# Patient Record
Sex: Female | Born: 1990 | Race: White | Hispanic: No | Marital: Single | Smoking: Current every day smoker
Health system: Southern US, Community
[De-identification: ages and names within clinical notes are randomized; demographics above are authoritative.]

## PROBLEM LIST (undated history)

## (undated) DIAGNOSIS — F329 Major depressive disorder, single episode, unspecified: Secondary | ICD-10-CM

## (undated) DIAGNOSIS — A630 Anogenital (venereal) warts: Secondary | ICD-10-CM

## (undated) DIAGNOSIS — F419 Anxiety disorder, unspecified: Secondary | ICD-10-CM

## (undated) DIAGNOSIS — F32A Depression, unspecified: Secondary | ICD-10-CM

## (undated) DIAGNOSIS — F1014 Alcohol abuse with alcohol-induced mood disorder: Secondary | ICD-10-CM

## (undated) DIAGNOSIS — F1410 Cocaine abuse, uncomplicated: Secondary | ICD-10-CM

## (undated) HISTORY — PX: NO PAST SURGERIES: SHX2092

---

## 2010-07-17 ENCOUNTER — Emergency Department (HOSPITAL_COMMUNITY)
Admission: EM | Admit: 2010-07-17 | Discharge: 2010-07-18 | Payer: Self-pay | Source: Home / Self Care | Admitting: Emergency Medicine

## 2010-08-31 ENCOUNTER — Telehealth (INDEPENDENT_AMBULATORY_CARE_PROVIDER_SITE_OTHER): Payer: Self-pay | Admitting: Internal Medicine

## 2010-12-11 NOTE — Progress Notes (Signed)
Summary: NEW PATIENT REQUESTING REF  Phone Note Call from Patient Call back at Home Phone [phone]   Reason for Call: Referral Summary of Call: NEW PATIENT TO COME IN ON NOV 08 TO SEE DR [facility] FOR THE FIRST TIME. SHE WAS SEEN IN THE ER FOR STOMACH PAINS AND ULCERS AND WAS TOLD TO BE SEEN BY HER DR. WE ARE ON HER MEDICAID CARD, AND [facility] DOESN'T WANT TO WAIT TILL 11/08, AND SHE IS AKSING FOR A REFERRAL TO SEE A STOMACH DR. Initial call taken by: Leodis Rains,  August 31, 2010 12:30 PM  Follow-up for Phone Call        Left message on answering machine for pt to call back.Marland KitchenMarland KitchenMarland KitchenArmenia Shannon  September 03, 2010 9:36 AM   Left message on answering machine for pt to call back...Marland KitchenMarland KitchenArmenia Shannon  September 04, 2010 9:10 AM   Additional Follow-up for Phone Call Additional follow up Details #1::        spoke with pt and she said that she is 13 miles form our office and she does not want to come here.... pt wants to go to occumed urgent care...Marland Kitchen per [facility] says we can authorize one visit and she needs to contact her medicad card worker to change the name on her card... Additional Follow-up by: [place] Shannon,  September 04, 2010 10:37 AM

## 2011-01-24 LAB — URINALYSIS, ROUTINE W REFLEX MICROSCOPIC
Glucose, UA: NEGATIVE mg/dL
Nitrite: NEGATIVE
Specific Gravity, Urine: 1.005 (ref 1.005–1.030)
pH: 6 (ref 5.0–8.0)

## 2011-01-24 LAB — DIFFERENTIAL
Basophils Absolute: 0.1 10*3/uL (ref 0.0–0.1)
Eosinophils Relative: 1 % (ref 0–5)
Lymphocytes Relative: 35 % (ref 12–46)
Lymphs Abs: 4.4 10*3/uL — ABNORMAL HIGH (ref 0.7–4.0)
Neutro Abs: 7.3 10*3/uL (ref 1.7–7.7)

## 2011-01-24 LAB — COMPREHENSIVE METABOLIC PANEL
AST: 23 U/L (ref 0–37)
BUN: 6 mg/dL (ref 6–23)
CO2: 27 mEq/L (ref 19–32)
Calcium: 8.9 mg/dL (ref 8.4–10.5)
Creatinine, Ser: 0.88 mg/dL (ref 0.4–1.2)
GFR calc Af Amer: >60 mL/min (ref >60)
GFR calc non Af Amer: >60 mL/min (ref >60)
Total Bilirubin: 0.1 mg/dL — ABNORMAL LOW (ref 0.3–1.2)

## 2011-01-24 LAB — CBC
Hemoglobin: 13.3 g/dL (ref 12.0–15.0)
MCH: 31.7 pg (ref 26.0–34.0)
MCHC: 33.6 g/dL (ref 30.0–36.0)
MCV: 94.5 fL (ref 78.0–100.0)
Platelets: 290 10*3/uL (ref 150–400)

## 2011-01-24 LAB — URINE MICROSCOPIC-ADD ON

## 2011-01-24 LAB — LIPASE, BLOOD: Lipase: 32 U/L (ref 11–59)

## 2011-02-21 ENCOUNTER — Inpatient Hospital Stay (HOSPITAL_COMMUNITY): Payer: Self-pay

## 2011-02-21 ENCOUNTER — Inpatient Hospital Stay (HOSPITAL_COMMUNITY)
Admission: AD | Admit: 2011-02-21 | Discharge: 2011-02-21 | Disposition: A | Discharge status: Home / Self Care | Payer: Self-pay | Source: Ambulatory Visit | Attending: Obstetrics & Gynecology | Admitting: Obstetrics & Gynecology

## 2011-02-21 DIAGNOSIS — A499 Bacterial infection, unspecified: Secondary | ICD-10-CM | POA: Insufficient documentation

## 2011-02-21 DIAGNOSIS — N949 Unspecified condition associated with female genital organs and menstrual cycle: Secondary | ICD-10-CM | POA: Insufficient documentation

## 2011-02-21 DIAGNOSIS — R112 Nausea with vomiting, unspecified: Secondary | ICD-10-CM

## 2011-02-21 DIAGNOSIS — N760 Acute vaginitis: Secondary | ICD-10-CM

## 2011-02-21 DIAGNOSIS — B9689 Other specified bacterial agents as the cause of diseases classified elsewhere: Secondary | ICD-10-CM | POA: Insufficient documentation

## 2011-02-21 LAB — COMPREHENSIVE METABOLIC PANEL
Albumin: 4 g/dL (ref 3.5–5.2)
BUN: 8 mg/dL (ref 6–23)
Calcium: 9.2 mg/dL (ref 8.4–10.5)
Chloride: 108 mEq/L (ref 96–112)
Creatinine, Ser: 0.77 mg/dL (ref 0.4–1.2)
GFR calc Af Amer: >60 mL/min (ref >60)
GFR calc non Af Amer: >60 mL/min (ref >60)
Total Bilirubin: 0.3 mg/dL (ref 0.3–1.2)

## 2011-02-21 LAB — DIFFERENTIAL
Basophils Relative: 1 % (ref 0–1)
Eosinophils Absolute: 0.5 10*3/uL (ref 0.0–0.7)
Eosinophils Relative: 4 % (ref 0–5)
Lymphs Abs: 5.5 10*3/uL — ABNORMAL HIGH (ref 0.7–4.0)
Monocytes Absolute: 0.9 10*3/uL (ref 0.1–1.0)
Monocytes Relative: 7 % (ref 3–12)
Neutrophils Relative %: 46 % (ref 43–77)

## 2011-02-21 LAB — URINALYSIS, ROUTINE W REFLEX MICROSCOPIC
Hgb urine dipstick: NEGATIVE
Nitrite: NEGATIVE
Protein, ur: NEGATIVE mg/dL
Specific Gravity, Urine: <1.005 — ABNORMAL LOW (ref 1.005–1.030)
Urobilinogen, UA: 0.2 mg/dL (ref 0.0–1.0)

## 2011-02-21 LAB — CBC
MCH: 30.2 pg (ref 26.0–34.0)
MCHC: 32 g/dL (ref 30.0–36.0)
MCV: 94.4 fL (ref 78.0–100.0)
Platelets: 289 10*3/uL (ref 150–400)
RBC: 4.1 MIL/uL (ref 3.87–5.11)

## 2011-02-21 LAB — WET PREP, GENITAL
Trich, Wet Prep: NONE SEEN
Yeast Wet Prep HPF POC: NONE SEEN

## 2011-02-21 LAB — POCT PREGNANCY, URINE: Preg Test, Ur: NEGATIVE

## 2011-02-22 LAB — GC/CHLAMYDIA PROBE AMP, GENITAL: Chlamydia, DNA Probe: NEGATIVE

## 2011-04-08 ENCOUNTER — Emergency Department (HOSPITAL_COMMUNITY): Payer: Medicaid Other

## 2011-04-08 ENCOUNTER — Emergency Department (HOSPITAL_COMMUNITY)
Admission: EM | Admit: 2011-04-08 | Discharge: 2011-04-09 | Disposition: A | Discharge status: Home / Self Care | Payer: Medicaid Other | Attending: Emergency Medicine | Admitting: Emergency Medicine

## 2011-04-08 DIAGNOSIS — M25669 Stiffness of unspecified knee, not elsewhere classified: Secondary | ICD-10-CM | POA: Insufficient documentation

## 2011-04-08 DIAGNOSIS — S99929A Unspecified injury of unspecified foot, initial encounter: Secondary | ICD-10-CM | POA: Insufficient documentation

## 2011-04-08 DIAGNOSIS — S8990XA Unspecified injury of unspecified lower leg, initial encounter: Secondary | ICD-10-CM | POA: Insufficient documentation

## 2011-04-08 DIAGNOSIS — S8000XA Contusion of unspecified knee, initial encounter: Secondary | ICD-10-CM | POA: Insufficient documentation

## 2011-04-08 DIAGNOSIS — M25569 Pain in unspecified knee: Secondary | ICD-10-CM | POA: Insufficient documentation

## 2011-04-08 DIAGNOSIS — M25469 Effusion, unspecified knee: Secondary | ICD-10-CM | POA: Insufficient documentation

## 2011-04-08 DIAGNOSIS — Y990 Civilian activity done for income or pay: Secondary | ICD-10-CM | POA: Insufficient documentation

## 2011-04-08 DIAGNOSIS — W010XXA Fall on same level from slipping, tripping and stumbling without subsequent striking against object, initial encounter: Secondary | ICD-10-CM | POA: Insufficient documentation

## 2011-06-24 ENCOUNTER — Emergency Department (HOSPITAL_COMMUNITY)
Admission: EM | Admit: 2011-06-24 | Discharge: 2011-06-25 | Disposition: A | Discharge status: Home / Self Care | Payer: Medicaid Other | Attending: Emergency Medicine | Admitting: Emergency Medicine

## 2011-06-24 DIAGNOSIS — T50901A Poisoning by unspecified drugs, medicaments and biological substances, accidental (unintentional), initial encounter: Secondary | ICD-10-CM | POA: Insufficient documentation

## 2011-06-24 DIAGNOSIS — F3289 Other specified depressive episodes: Secondary | ICD-10-CM | POA: Insufficient documentation

## 2011-06-24 DIAGNOSIS — F329 Major depressive disorder, single episode, unspecified: Secondary | ICD-10-CM | POA: Insufficient documentation

## 2011-06-24 DIAGNOSIS — T50904A Poisoning by unspecified drugs, medicaments and biological substances, undetermined, initial encounter: Secondary | ICD-10-CM | POA: Insufficient documentation

## 2011-06-25 LAB — URINALYSIS, ROUTINE W REFLEX MICROSCOPIC
Glucose, UA: NEGATIVE mg/dL
Hgb urine dipstick: NEGATIVE
Specific Gravity, Urine: 1.015 (ref 1.005–1.030)
Urobilinogen, UA: 0.2 mg/dL (ref 0.0–1.0)
pH: 6 (ref 5.0–8.0)

## 2011-06-25 LAB — CBC
Hemoglobin: 14.1 g/dL (ref 12.0–15.0)
Platelets: 329 10*3/uL (ref 150–400)
RBC: 4.39 MIL/uL (ref 3.87–5.11)
WBC: 12.7 10*3/uL — ABNORMAL HIGH (ref 4.0–10.5)

## 2011-06-25 LAB — DIFFERENTIAL
Basophils Relative: 0 % (ref 0–1)
Eosinophils Absolute: 0.1 10*3/uL (ref 0.0–0.7)
Neutro Abs: 8.4 10*3/uL — ABNORMAL HIGH (ref 1.7–7.7)
Neutrophils Relative %: 67 % (ref 43–77)

## 2011-06-25 LAB — COMPREHENSIVE METABOLIC PANEL
ALT: 20 U/L (ref 0–35)
AST: 19 U/L (ref 0–37)
Albumin: 3.9 g/dL (ref 3.5–5.2)
Alkaline Phosphatase: 59 U/L (ref 39–117)
Potassium: 3.8 mEq/L (ref 3.5–5.1)
Sodium: 141 mEq/L (ref 135–145)
Total Protein: 6.9 g/dL (ref 6.0–8.3)

## 2011-06-25 LAB — ACETAMINOPHEN LEVEL
Acetaminophen (Tylenol), Serum: 31.2 ug/mL — ABNORMAL HIGH (ref 10–30)
Acetaminophen (Tylenol), Serum: 18.6 ug/mL (ref 10–30)

## 2011-06-25 LAB — ETHANOL: Alcohol, Ethyl (B): <11 mg/dL (ref 0–11)

## 2011-06-25 LAB — RAPID URINE DRUG SCREEN, HOSP PERFORMED: Barbiturates: NOT DETECTED

## 2011-06-25 LAB — SALICYLATE LEVEL: Salicylate Lvl: <2 mg/dL — ABNORMAL LOW (ref 2.8–20.0)

## 2013-01-10 ENCOUNTER — Emergency Department (HOSPITAL_COMMUNITY): Payer: Self-pay

## 2013-01-10 ENCOUNTER — Encounter (HOSPITAL_COMMUNITY): Payer: Self-pay | Admitting: *Deleted

## 2013-01-10 ENCOUNTER — Emergency Department (HOSPITAL_COMMUNITY)
Admission: EM | Admit: 2013-01-10 | Discharge: 2013-01-11 | Disposition: A | Discharge status: Home / Self Care | Payer: Self-pay | Attending: Emergency Medicine | Admitting: Emergency Medicine

## 2013-01-10 DIAGNOSIS — A599 Trichomoniasis, unspecified: Secondary | ICD-10-CM | POA: Insufficient documentation

## 2013-01-10 DIAGNOSIS — F17200 Nicotine dependence, unspecified, uncomplicated: Secondary | ICD-10-CM | POA: Insufficient documentation

## 2013-01-10 DIAGNOSIS — R319 Hematuria, unspecified: Secondary | ICD-10-CM | POA: Insufficient documentation

## 2013-01-10 DIAGNOSIS — R109 Unspecified abdominal pain: Secondary | ICD-10-CM | POA: Insufficient documentation

## 2013-01-10 DIAGNOSIS — N938 Other specified abnormal uterine and vaginal bleeding: Secondary | ICD-10-CM | POA: Insufficient documentation

## 2013-01-10 DIAGNOSIS — Z3202 Encounter for pregnancy test, result negative: Secondary | ICD-10-CM | POA: Insufficient documentation

## 2013-01-10 LAB — CBC WITH DIFFERENTIAL/PLATELET
Basophils Absolute: 0 10*3/uL (ref 0.0–0.1)
Eosinophils Relative: 2 % (ref 0–5)
HCT: 37.7 % (ref 36.0–46.0)
Hemoglobin: 12.9 g/dL (ref 12.0–15.0)
Lymphocytes Relative: 31 % (ref 12–46)
Lymphs Abs: 2.6 10*3/uL (ref 0.7–4.0)
MCV: 93.3 fL (ref 78.0–100.0)
Monocytes Absolute: 1 10*3/uL (ref 0.1–1.0)
Monocytes Relative: 12 % (ref 3–12)
Neutro Abs: 4.6 10*3/uL (ref 1.7–7.7)
RDW: 12.6 % (ref 11.5–15.5)
WBC: 8.4 10*3/uL (ref 4.0–10.5)

## 2013-01-10 LAB — COMPREHENSIVE METABOLIC PANEL
AST: 13 U/L (ref 0–37)
BUN: 12 mg/dL (ref 6–23)
CO2: 24 mEq/L (ref 19–32)
Calcium: 8.5 mg/dL (ref 8.4–10.5)
Chloride: 102 mEq/L (ref 96–112)
Creatinine, Ser: 0.94 mg/dL (ref 0.50–1.10)
GFR calc Af Amer: >90 mL/min (ref >90)
GFR calc non Af Amer: 86 mL/min — ABNORMAL LOW (ref >90)
Glucose, Bld: 95 mg/dL (ref 70–99)
Total Bilirubin: 0.2 mg/dL — ABNORMAL LOW (ref 0.3–1.2)

## 2013-01-10 LAB — POCT PREGNANCY, URINE: Preg Test, Ur: NEGATIVE

## 2013-01-10 LAB — URINE MICROSCOPIC-ADD ON

## 2013-01-10 LAB — URINALYSIS, ROUTINE W REFLEX MICROSCOPIC
Ketones, ur: 15 mg/dL — AB
Nitrite: POSITIVE — AB
Specific Gravity, Urine: 1.032 — ABNORMAL HIGH (ref 1.005–1.030)
Urobilinogen, UA: 1 mg/dL (ref 0.0–1.0)
pH: 6 (ref 5.0–8.0)

## 2013-01-10 LAB — WET PREP, GENITAL: Clue Cells Wet Prep HPF POC: NONE SEEN

## 2013-01-10 LAB — LIPASE, BLOOD: Lipase: 39 U/L (ref 11–59)

## 2013-01-10 MED ORDER — MORPHINE SULFATE 4 MG/ML IJ SOLN
4.0000 mg | Freq: Once | INTRAMUSCULAR | Status: DC
  Filled 2013-01-10: qty 1

## 2013-01-10 MED ORDER — GI COCKTAIL ~~LOC~~
30.0000 mL | Freq: Once | ORAL | Status: AC
  Administered 2013-01-10: 30 mL via ORAL
  Filled 2013-01-10: qty 30

## 2013-01-10 MED ORDER — OXYCODONE-ACETAMINOPHEN 5-325 MG PO TABS
1.0000 | ORAL_TABLET | Freq: Once | ORAL | Status: AC
Start: 2013-01-10 — End: 2013-01-10
  Administered 2013-01-10: 1 via ORAL
  Filled 2013-01-10: qty 1

## 2013-01-10 MED ORDER — FAMOTIDINE 20 MG PO TABS
20.0000 mg | ORAL_TABLET | Freq: Once | ORAL | Status: AC
  Administered 2013-01-10: 20 mg via ORAL
  Filled 2013-01-10: qty 1

## 2013-01-10 NOTE — ED Notes (Addendum)
PER EMS pt c/o rt side 10/10 flank pain. She also c/o pain clots in urine and passing clots.Denies being pregnant.State sshe thought it would be the start of her menses. Denies Nausea or vomiting.

## 2013-01-10 NOTE — ED Provider Notes (Signed)
History     CSN: [id number]  Arrival date & time 01/10/13  1933   First MD Initiated Contact with Patient 01/10/13 1938      Chief Complaint  Patient presents with  . Flank Pain    (Consider location/radiation/quality/duration/timing/severity/associated sxs/prior treatment) HPI History provided by pt.   Pt has had diffuse, mild abdominal pain for the past 3 weeks.  Approx 3 days ago, it localized to right side of abdomen and has become gradually more severe since then.  Intolerable today.  Aggravated by laying on right side as well as eating and taking deep breaths.  Developed vaginal bleeding 2-3 days ago as well.  Periods have been irregular recently and she is unsure if this is her menstrual cycle.  No associated fever, cough, SOB, N/V/D, anorexia, hematemesis/hematochezia/melena or other GU sx.  No h/o abdominal surgeries.  No past medical history on file.  No past surgical history on file.  No family history on file.  History  Substance Use Topics  . Smoking status: Current Every Day Smoker -- 1.00 packs/day for 5 years    Types: Cigarettes, Cigars  . Smokeless tobacco: Not on file  . Alcohol Use: Not on file     Comment: rare    OB History   Grav Para Term Preterm Abortions TAB SAB Ect Mult Living                  Review of Systems  All other systems reviewed and are negative.    Allergies  Review of patient's allergies indicates no known allergies.  Home Medications  No current outpatient prescriptions on file.  BP 116/63  Pulse 100  Temp(Src) 98.3 F (36.8 C) (Oral)  Resp 18  SpO2 96%  LMP 11/11/2012  Physical Exam  Nursing note and vitals reviewed. Constitutional: She is oriented to person, place, and time. She appears well-developed and well-nourished. No distress.  HENT:  Head: Normocephalic and atraumatic.  Eyes:  Normal appearance  Neck: Normal range of motion.  Cardiovascular: Normal rate and regular rhythm.   Pulmonary/Chest: Effort  normal and breath sounds normal. No respiratory distress.  Abdominal: Soft. Bowel sounds are normal. She exhibits no distension and no mass. There is no rebound and no guarding.  Periumbilical, epigastric and RUQ ttp; worst in RUQ  Genitourinary:  No CVA tenderness.  Nml external genitalia. Vaginal bleeding.  Cervix closed and appears nml.  No adnexal or cervical motion tenderess.    Musculoskeletal: Normal range of motion.  Neurological: She is alert and oriented to person, place, and time.  Skin: Skin is warm and dry. No rash noted.  Psychiatric: She has a normal mood and affect. Her behavior is normal.    ED Course  Procedures (including critical care time)  Labs Reviewed  WET PREP, GENITAL - Abnormal; Notable for the following:    Trich, Wet Prep FEW (*)    All other components within normal limits  URINALYSIS, ROUTINE W REFLEX MICROSCOPIC - Abnormal; Notable for the following:    Color, Urine RED (*)    APPearance TURBID (*)    Specific Gravity, Urine 1.032 (*)    Hgb urine dipstick LARGE (*)    Bilirubin Urine MODERATE (*)    Ketones, ur 15 (*)    Protein, ur 100 (*)    Nitrite POSITIVE (*)    Leukocytes, UA MODERATE (*)    All other components within normal limits  COMPREHENSIVE METABOLIC PANEL - Abnormal; Notable for the following:  Albumin 3.0 (*)    Total Bilirubin 0.2 (*)    GFR calc non Af Amer 86 (*)    All other components within normal limits  URINE MICROSCOPIC-ADD ON - Abnormal; Notable for the following:    Bacteria, UA FEW (*)    All other components within normal limits  GC/CHLAMYDIA PROBE AMP  URINE CULTURE  CBC WITH DIFFERENTIAL  LIPASE, BLOOD  POCT PREGNANCY, URINE   Ct Abdomen Pelvis Wo Contrast  01/11/2013  *RADIOLOGY REPORT*  Clinical Data: Right flank pain.  Red blood cells and white blood cells in the urine.  CT ABDOMEN AND PELVIS WITHOUT CONTRAST  Technique:  Multidetector CT imaging of the abdomen and pelvis was performed following the  standard protocol without intravenous contrast.  Comparison: Abdominal ultrasound performed 01/10/2013, and pelvic ultrasound performed 02/21/2011  Findings: The visualized lung bases are clear.  The liver and spleen are unremarkable in appearance.  The gallbladder is within normal limits.  The pancreas and adrenal glands are unremarkable.  The kidneys are unremarkable in appearance.  There is no evidence of hydronephrosis.  No renal or ureteral stones are seen.  No perinephric stranding is appreciated.  No free fluid is identified.  The small bowel is unremarkable in appearance.  The stomach is within normal limits.  No acute vascular abnormalities are seen.  The appendix normal in caliber, without evidence for appendicitis. The colon is unremarkable in appearance.  The bladder is largely decompressed and grossly unremarkable, though difficult to fully assess.  The uterus is grossly normal in appearance.  The ovaries are relatively symmetric; no suspicious adnexal masses are seen.  Evaluation of the pelvis is mildly suboptimal without contrast.  No inguinal lymphadenopathy is seen.  No acute osseous abnormalities are identified.  IMPRESSION: Unremarkable noncontrast CT of the abdomen and pelvis.   Original Report Authenticated By: Tonia Ghent, M.D.    US Abdomen Complete  01/10/2013  *RADIOLOGY REPORT*  Clinical Data:  Right upper quadrant abdominal pain.  ABDOMINAL ULTRASOUND COMPLETE  Comparison:  None  Findings:  Gallbladder:  The gallbladder is mildly contracted and somewhat thick-walled, reflecting the patient's non-NPO status.  No stones or sludge is seen.  However, a positive ultrasonographic Murphy's sign is elicited.  This is of uncertain significance.  Common Bile Duct:  0.5 cm in diameter; within normal limits in caliber.  Liver:  Diffusely increased hepatic echogenicity and coarsened echotexture, compatible with fatty infiltration; no focal lesions identified.  Limited Doppler evaluation  demonstrates normal blood flow within the liver.  IVC:  Unremarkable in appearance.  Pancreas:  Although the pancreas is difficult to visualize in its entirety due to overlying bowel gas, no focal pancreatic abnormality is identified.  Spleen:  10.9 cm in length; within normal limits in size and echotexture.  Right kidney:  10.9 cm in length; normal in size, configuration and parenchymal echogenicity.  No evidence of mass or hydronephrosis.  Left kidney:  11.0 cm in length; normal in size, configuration and parenchymal echogenicity.  No evidence of hydronephrosis.  A vague mildly complex 1.3 x 1.2 x 0.9 cm hypoechoic focus at the lower pole of the left kidney likely reflects a mildly complex benign cyst.  Abdominal Aorta:  Normal in caliber; no aneurysm identified.  Not well characterized distally due to overlying bowel gas.  IMPRESSION:  1.  Relatively contracted gallbladder, reflecting the patient's non- NPO status.  No stones or sludge seen.  However, a positive ultrasonographic Murphy's sign is elicited.  This is of  uncertain significance; there are no ancillary findings to suggest cholecystitis. 2.  Diffuse fatty infiltration within the liver. 3.  Likely small mildly complex benign left renal cyst.   Original Report Authenticated By: Tonia Ghent, M.D.      1. Abdominal pain   2. Trichomoniasis   3. Hematuria       MDM  21yo healthy F presents w/ right-sided abdominal pain w/out associated sx.  On exam, afebrile, NAD, abd soft, periumbilical, epigastric and particularly RUQ ttp, unremarkable genitalia. Suspect cholelithiasis or gastritis.  Appendicitis less likely based on location of tenderness.   Labs and [place] pending.  Percocet, pepcid and GI cocktail ordered.  Will reassess shortly.  9:00 PM   Pt reports that pain has improved.  On repeat exam, continues to have significant tenderness in RUQ.  Labs unremarkable w/ exception of hematuria and trich, and [place] neg for gallstones but pt had  sonographic murphy's sign.  Discussed w/ Dr. 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  Will culture urine as well.  11:14 PM   CT negative.  All results discussed w/ patient.  Her pain is improved and she is tolerating pos.  She received 2g po flagyl.  D/c'd home w/ percocet.  Advised her to return in 2 days if no improvement in symptoms and sooner if pain worsens.  Referred to [facility].  1:26 AM         Otilio Miu, PA-C 01/11/13 1249

## 2013-01-10 NOTE — ED Notes (Signed)
BJY:[id number]<FA> Expected date:<BR> Expected time:<BR> Means of arrival:<BR> Comments:<BR> Fall wrist pain

## 2013-01-11 MED ORDER — OXYCODONE-ACETAMINOPHEN 5-325 MG PO TABS: 2.0000 | ORAL_TABLET | ORAL | Status: DC | PRN

## 2013-01-11 MED ORDER — METRONIDAZOLE 500 MG PO TABS
2000.0000 mg | ORAL_TABLET | Freq: Once | ORAL | Status: AC
  Administered 2013-01-11: 2000 mg via ORAL
  Filled 2013-01-11: qty 4

## 2013-01-11 MED ORDER — ONDANSETRON 8 MG PO TBDP
8.0000 mg | ORAL_TABLET | Freq: Once | ORAL | Status: AC
  Administered 2013-01-11: 8 mg via ORAL
  Filled 2013-01-11: qty 1

## 2013-01-11 NOTE — ED Provider Notes (Signed)
Medical screening examination/treatment/procedure(s) were performed by non-physician practitioner and as supervising physician I was immediately available for consultation/collaboration.    Celene Kras, MD 01/11/13 [phone]

## 2013-01-12 LAB — URINE CULTURE: Colony Count: NO GROWTH

## 2013-01-12 LAB — GC/CHLAMYDIA PROBE AMP: GC Probe RNA: NEGATIVE

## 2013-01-16 ENCOUNTER — Telehealth (HOSPITAL_COMMUNITY): Payer: Self-pay | Admitting: Emergency Medicine

## 2013-01-17 ENCOUNTER — Telehealth (HOSPITAL_COMMUNITY): Payer: Self-pay | Admitting: Emergency Medicine

## 2013-01-23 ENCOUNTER — Telehealth (HOSPITAL_COMMUNITY): Payer: Self-pay | Admitting: Emergency Medicine

## 2013-01-23 NOTE — ED Notes (Signed)
Unable to contact patient via phone. Sent letter. °

## 2013-02-15 ENCOUNTER — Telehealth (HOSPITAL_COMMUNITY): Payer: Self-pay | Admitting: Emergency Medicine

## 2013-02-15 NOTE — ED Notes (Signed)
No response to letter sent after 30 days. Chart sent to Medical Records. °

## 2013-02-22 ENCOUNTER — Encounter (HOSPITAL_COMMUNITY): Payer: Self-pay | Admitting: Emergency Medicine

## 2013-02-22 ENCOUNTER — Emergency Department (HOSPITAL_COMMUNITY)
Admission: EM | Admit: 2013-02-22 | Discharge: 2013-02-22 | Discharge status: Against Medical Advice | Payer: Self-pay | Attending: Emergency Medicine | Admitting: Emergency Medicine

## 2013-02-22 DIAGNOSIS — Z202 Contact with and (suspected) exposure to infections with a predominantly sexual mode of transmission: Secondary | ICD-10-CM | POA: Insufficient documentation

## 2013-02-22 DIAGNOSIS — Z8619 Personal history of other infectious and parasitic diseases: Secondary | ICD-10-CM | POA: Insufficient documentation

## 2013-02-22 DIAGNOSIS — N898 Other specified noninflammatory disorders of vagina: Secondary | ICD-10-CM | POA: Insufficient documentation

## 2013-02-22 NOTE — ED Notes (Signed)
Pt c/o vaginal discharge with hx of trichomonas and thinks has same as partner did not get treated

## 2013-06-05 ENCOUNTER — Emergency Department (HOSPITAL_COMMUNITY)
Admission: EM | Admit: 2013-06-05 | Discharge: 2013-06-05 | Disposition: A | Discharge status: Home / Self Care | Payer: Self-pay | Attending: Emergency Medicine | Admitting: Emergency Medicine

## 2013-06-05 ENCOUNTER — Encounter (HOSPITAL_COMMUNITY): Payer: Self-pay

## 2013-06-05 DIAGNOSIS — F17200 Nicotine dependence, unspecified, uncomplicated: Secondary | ICD-10-CM | POA: Insufficient documentation

## 2013-06-05 DIAGNOSIS — L03211 Cellulitis of face: Secondary | ICD-10-CM | POA: Insufficient documentation

## 2013-06-05 DIAGNOSIS — L0201 Cutaneous abscess of face: Secondary | ICD-10-CM | POA: Insufficient documentation

## 2013-06-05 MED ORDER — CEPHALEXIN 250 MG PO CAPS
1000.0000 mg | ORAL_CAPSULE | Freq: Once | ORAL | Status: AC
Start: 2013-06-05 — End: 2013-06-05
  Administered 2013-06-05: 1000 mg via ORAL
  Filled 2013-06-05: qty 4

## 2013-06-05 MED ORDER — SULFAMETHOXAZOLE-TRIMETHOPRIM 800-160 MG PO TABS: 1.0000 | ORAL_TABLET | Freq: Two times a day (BID) | ORAL | Status: DC

## 2013-06-05 MED ORDER — OXYCODONE-ACETAMINOPHEN 5-325 MG PO TABS
1.0000 | ORAL_TABLET | Freq: Once | ORAL | Status: AC
  Administered 2013-06-05: 1 via ORAL
  Filled 2013-06-05: qty 1

## 2013-06-05 MED ORDER — HYDROCODONE-ACETAMINOPHEN 5-325 MG PO TABS: 1.0000 | ORAL_TABLET | ORAL | Status: DC | PRN

## 2013-06-05 MED ORDER — SULFAMETHOXAZOLE-TMP DS 800-160 MG PO TABS
1.0000 | ORAL_TABLET | Freq: Once | ORAL | Status: AC
  Administered 2013-06-05: 1 via ORAL
  Filled 2013-06-05: qty 1

## 2013-06-05 MED ORDER — CEPHALEXIN 500 MG PO CAPS: 1000.0000 mg | ORAL_CAPSULE | Freq: Two times a day (BID) | ORAL | Status: DC

## 2013-06-05 NOTE — Discharge Instructions (Signed)
Take antibiotic as prescribed. Take vicodin as prescribed for severe pain.  Do not drive within four hours of taking this medication (may cause drowsiness or confusion).   Please return to the ER immediately if you develop fever, your pain worsens or your swelling increases.   Abscess An abscess is an infected area that contains a collection of pus and debris.It can occur in almost any part of the body. An abscess is also known as a furuncle or boil. CAUSES  An abscess occurs when tissue gets infected. This can occur from blockage of oil or sweat glands, infection of hair follicles, or a minor injury to the skin. As the body tries to fight the infection, pus collects in the area and creates pressure under the skin. This pressure causes pain. People with weakened immune systems have difficulty fighting infections and get certain abscesses more often.  SYMPTOMS Usually an abscess develops on the skin and becomes a painful mass that is red, warm, and tender. If the abscess forms under the skin, you may feel a moveable soft area under the skin. Some abscesses break open (rupture) on their own, but most will continue to get worse without care. The infection can spread deeper into the body and eventually into the bloodstream, causing you to feel ill.  DIAGNOSIS  Your caregiver will take your medical history and perform a physical exam. A sample of fluid may also be taken from the abscess to determine what is causing your infection. TREATMENT  Your caregiver may prescribe antibiotic medicines to fight the infection. However, taking antibiotics alone usually does not cure an abscess. Your caregiver may need to make a small cut (incision) in the abscess to drain the pus. In some cases, gauze is packed into the abscess to reduce pain and to continue draining the area. HOME CARE INSTRUCTIONS   Only take over-the-counter or prescription medicines for pain, discomfort, or fever as directed by your caregiver.  If  you were prescribed antibiotics, take them as directed. Finish them even if you start to feel better.  If gauze is used, follow your caregiver's directions for changing the gauze.  To avoid spreading the infection:  Keep your draining abscess covered with a bandage.  Wash your hands well.  Do not share personal care items, towels, or whirlpools with others.  Avoid skin contact with others.  Keep your skin and clothes clean around the abscess.  Keep all follow-up appointments as directed by your caregiver. SEEK MEDICAL CARE IF:   You have increased pain, swelling, redness, fluid drainage, or bleeding.  You have muscle aches, chills, or a general ill feeling.  You have a fever. MAKE SURE YOU:   Understand these instructions.  Will watch your condition.  Will get help right away if you are not doing well or get worse. Document Released: 08/07/2005 Document Revised: 04/28/2012 Document Reviewed: 01/10/2012 [facility] Patient Information 2014 [place], [place].

## 2013-06-05 NOTE — ED Notes (Signed)
Pt ask for something to eat. Pt said she could have a Happy Meal. Pt was given a Happy Meal and a sprite.

## 2013-06-05 NOTE — ED Provider Notes (Signed)
  CSN: [id number]     Arrival date & time 06/05/13  0429 History     First MD Initiated Contact with Patient 06/05/13 [phone]     Chief Complaint  Patient presents with  . Insect Bite   (Consider location/radiation/quality/duration/timing/severity/associated sxs/prior Treatment) HPI History provided by pt.   Pt noticed what appeared to be a pimple on her L cheek when she woke yesterday am.  Has had gradually increasing edema since then.  Associated w/ burning pain.  Denies fever and dental pain.  Does not believe she was bitten by anything.  No PMH.   No past medical history on file. No past surgical history on file. No family history on file. History  Substance Use Topics  . Smoking status: Current Every Day Smoker -- 1.00 packs/day for 5 years    Types: Cigarettes, Cigars  . Smokeless tobacco: Not on file  . Alcohol Use: Not on file     Comment: rare   OB History   Grav Para Term Preterm Abortions TAB SAB Ect Mult Living                 Review of Systems  All other systems reviewed and are negative.    Allergies  Review of patient's allergies indicates no known allergies.  Home Medications  No current outpatient prescriptions on file. BP 106/63  Pulse 100  Temp(Src) 97.3 F (36.3 C) (Oral)  Resp 18  SpO2 96% Physical Exam  Nursing note and vitals reviewed. Constitutional: She is oriented to person, place, and time. She appears well-developed and well-nourished. No distress.  HENT:  Head: Normocephalic and atraumatic.  Edema and induration of entire L cheek, including buccal mucosa.  Honey-color crusted vesicular lesion in center of cheek.  No drainage.  No surrounding erythema.  Very ttp.  Nml dentition w/out tenderness.  Visualized w/ bedside US and there is a tiny fluid collection under cheek lesion.   Eyes:  Normal appearance  Neck: Normal range of motion.  Pulmonary/Chest: Effort normal.  Musculoskeletal: Normal range of motion.  Neurological: She is alert and  oriented to person, place, and time.  Psychiatric: She has a normal mood and affect. Her behavior is normal.    ED Course   Procedures (including critical care time) INCISION AND DRAINAGE Performed by: Ruby Cola E Consent: Verbal consent obtained. Risks and benefits: risks, benefits and alternatives were discussed Type: abscess  Body area: L cheek  Anesthesia: local infiltration  Needle aspiration.  Local anesthetic: lidocaine 2% w/out epinephrine  Anesthetic total: 4 ml  Complexity: simple   Drainage amount: none  Packing material: none  Patient tolerance: Patient tolerated the procedure well with no immediate complications.    Labs Reviewed - No data to display No results found. 1. Abscess of left external cheek     MDM  Healthy 22yo F presents w/ edema/induration and vesicular lesion L cheek.  Visualized w/ bedside US and found a tiny underlying fluid collection.  Attempted needle aspiration but no drainage.  Gave first dose of keflex and bactrim as well as vicodin in ED, and d/c'd home w/ same + referral to plastic surgery.  Return precautions discussed.   Otilio Miu, PA-C 06/05/13 2201

## 2013-06-05 NOTE — ED Notes (Signed)
Pt reports insect bite to left cheek, onset yesterday after taking a nap woke up and had bite mark, through out day has become more swollen and inflamed.

## 2013-06-06 NOTE — ED Provider Notes (Signed)
Medical screening examination/treatment/procedure(s) were conducted as a shared visit with non-physician practitioner(s) and myself.  I personally evaluated the patient during the encounter  L cheek pain and swelling, on exam has erythema, swelling and tenderness, no pointing or active drainage and no discrete area of central fluctuance.   Needle drainage attempted. ABx provided. PT discharged home with ABx and plan 48 hour recheck with ENT or in the ED  Sunnie Nielsen, MD 06/06/13 [phone]

## 2014-05-29 ENCOUNTER — Encounter (HOSPITAL_COMMUNITY): Payer: Self-pay | Admitting: Emergency Medicine

## 2014-05-29 ENCOUNTER — Emergency Department (HOSPITAL_COMMUNITY)
Admission: EM | Admit: 2014-05-29 | Discharge: 2014-05-30 | Disposition: A | Discharge status: Home / Self Care | Payer: Medicaid Other | Attending: Emergency Medicine | Admitting: Emergency Medicine

## 2014-05-29 DIAGNOSIS — L03119 Cellulitis of unspecified part of limb: Principal | ICD-10-CM

## 2014-05-29 DIAGNOSIS — L539 Erythematous condition, unspecified: Secondary | ICD-10-CM | POA: Insufficient documentation

## 2014-05-29 DIAGNOSIS — L0291 Cutaneous abscess, unspecified: Secondary | ICD-10-CM

## 2014-05-29 DIAGNOSIS — Z87891 Personal history of nicotine dependence: Secondary | ICD-10-CM | POA: Insufficient documentation

## 2014-05-29 DIAGNOSIS — L0390 Cellulitis, unspecified: Secondary | ICD-10-CM

## 2014-05-29 DIAGNOSIS — L02419 Cutaneous abscess of limb, unspecified: Secondary | ICD-10-CM | POA: Insufficient documentation

## 2014-05-29 MED ORDER — CLINDAMYCIN HCL 300 MG PO CAPS
450.0000 mg | ORAL_CAPSULE | Freq: Once | ORAL | Status: AC
  Administered 2014-05-30: 450 mg via ORAL
  Filled 2014-05-29: qty 1

## 2014-05-29 MED ORDER — ONDANSETRON 4 MG PO TBDP
4.0000 mg | ORAL_TABLET | Freq: Once | ORAL | Status: AC
  Administered 2014-05-30: 4 mg via ORAL
  Filled 2014-05-29: qty 1

## 2014-05-29 MED ORDER — OXYCODONE-ACETAMINOPHEN 5-325 MG PO TABS
2.0000 | ORAL_TABLET | Freq: Once | ORAL | Status: AC
  Administered 2014-05-30: 2 via ORAL
  Filled 2014-05-29: qty 2

## 2014-05-29 NOTE — ED Notes (Addendum)
[facility] Rates pain 9/10. (denies: nv or fever). L shin abscess: redness, swelling, tenderness & drainage. Pt describes drainage as clear yellow. No active drainage at this time. 17cm x 19cm. Area marked with skin marker.

## 2014-05-29 NOTE — ED Provider Notes (Signed)
CSN: [id number]     Arrival date & time 05/29/14  2315 History   First MD Initiated Contact with Patient 05/29/14 2333     Chief Complaint  Patient presents with  . Abscess     (Consider location/radiation/quality/duration/timing/severity/associated sxs/prior Treatment) HPI  Patient to the ER with complaints of wound to left lower leg. She reports there was a pimple two days ago and then today is got significantly worse and is draining yellow puss. She now has redness extending across her shin. She has not had any nausea, vomiting, diarrhea. The wound is painful and she thinks she may have been bitten by a spider.  History reviewed. No pertinent past medical history. History reviewed. No pertinent past surgical history. No family history on file. History  Substance Use Topics  . Smoking status: Former Smoker -- 1.00 packs/day for 5 years    Types: Cigarettes, Cigars  . Smokeless tobacco: Not on file  . Alcohol Use: No     Comment: rare   OB History   Grav Para Term Preterm Abortions TAB SAB Ect Mult Living                 Review of Systems  Review of Systems  Gen: no weight loss, fevers, chills, night sweats  Eyes: no discharge or drainage, no occular pain or visual changes  Nose: no epistaxis or rhinorrhea  Mouth: no dental pain, no sore throat  Neck: no neck pain  Lungs:No wheezing, coughing or hemoptysis CV: no chest pain, palpitations, dependent edema or orthopnea  Abd: no abdominal pain, nausea, vomiting, diarrhea GU: no dysuria or gross hematuria  MSK:  No muscle weakness or pain Neuro: no headache, no focal neurologic deficits  Skin: + rash and wounds Psyche: no complaints   Allergies  Review of patient's allergies indicates no known allergies.  Home Medications   Prior to Admission medications   Medication Sig Start Date End Date Taking? Authorizing Provider  clindamycin (CLEOCIN) 150 MG capsule Take 1 capsule (150 mg total) by mouth every 6 (six) hours.  05/30/14   Dorthula Matasiffany G Chrystian Ressler, PA-C  HYDROcodone-acetaminophen (NORCO/VICODIN) 5-325 MG per tablet Take 1-2 tablets by mouth every 4 (four) hours as needed. 05/30/14   Bertel Venard Irine SealG Venesa Semidey, PA-C  ondansetron (ZOFRAN) 4 MG tablet Take 1 tablet (4 mg total) by mouth every 6 (six) hours. 05/30/14   Jaxxon Naeem Irine SealG Grabiel Schmutz, PA-C   BP 114/65  Pulse 103  Temp(Src) 98.7 F (37.1 C) (Oral)  Resp 18  Ht 5\' 6"  (1.676 m)  Wt 160 lb (72.576 kg)  BMI 25.84 kg/m2  SpO2 100%  LMP 05/15/2014 Physical Exam  Nursing note and vitals reviewed. Constitutional: She appears well-developed and well-nourished. No distress.  HENT:  Head: Normocephalic and atraumatic.  Eyes: Pupils are equal, round, and reactive to light.  Neck: Normal range of motion. Neck supple.  Cardiovascular: Normal rate and regular rhythm.   Pulmonary/Chest: Effort normal.  Abdominal: Soft.  Neurological: She is alert.  Skin: Skin is warm and dry. No abrasion, no burn, no petechiae and no rash noted. Rash is not pustular and not urticarial. There is erythema.     Cellulitis to left shin. It is not circumferential sparing the calf. It does not extend up to the knee and the rash does not extend to the foot. The skin is soft except for a 2 x 2 cm area of abscess that is indurated and firm. Pedal pulses are physiologic.    ED Course  Procedures (including critical care time) Labs Review Labs Reviewed - No data to display  Imaging Review No results found.   EKG Interpretation None      MDM   Final diagnoses:  Abscess and cellulitis   Medications  oxyCODONE-acetaminophen (PERCOCET/ROXICET) 5-325 MG per tablet 2 tablet (2 tablets Oral Given 05/30/14 0001)  ondansetron (ZOFRAN-ODT) disintegrating tablet 4 mg (4 mg Oral Given 05/30/14 0002)  clindamycin (CLEOCIN) capsule 450 mg (450 mg Oral Given 05/30/14 0002)    INCISION AND DRAINAGE Performed by: Dorthula Matas Consent: Verbal consent obtained. Risks and benefits: risks, benefits and  alternatives were discussed Type: abscess  Body area: left shin  Anesthesia: local infiltration  Incision was made with a scalpel.  Local anesthetic: lidocaine 2% with epinephrine  Anesthetic total: 4 ml  Complexity: complex Blunt dissection to break up loculations  Drainage: purulent  Drainage amount: moderate   Packing material: 1/4 in iodoform gauze  Patient tolerance: Patient tolerated the procedure well with no immediate complications.   Patient will be started on Clindamycin PO and given pain medications for home. She will need a wound recheck in 2 days for re-evaluation.  clindamycin (CLEOCIN) 150 MG capsule Take 1 capsule (150 mg total) by mouth every 6 (six) hours. 28 capsule Dorthula Matas, PA-C HYDROcodone-acetaminophen (NORCO/VICODIN) 5-325 MG per tablet Take 1-2 tablets by mouth every 4 (four) hours as needed. 20 tablet Dorthula Matas, PA-C ondansetron (ZOFRAN) 4 MG tablet Take 1 tablet (4 mg total) by mouth every 6 (six) hours. 12 tablet Dorthula Matas, PA-C   23 y.o.Jalaiya Tineo's evaluation in the Emergency Department is complete. It has been determined that no acute conditions requiring further emergency intervention are present at this time. The patient/guardian have been advised of the diagnosis and plan. We have discussed signs and symptoms that warrant return to the ED, such as changes or worsening in symptoms.  Vital signs are stable at discharge. Filed Vitals:   05/30/14 0000  BP: 114/65  Pulse: 103  Temp:   Resp:     Patient/guardian has voiced understanding and agreed to follow-up with the PCP or specialist.    Dorthula Matas, PA-C 05/30/14 0028
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
## 2014-05-30 MED ORDER — ONDANSETRON HCL 4 MG PO TABS: 4.0000 mg | ORAL_TABLET | Freq: Four times a day (QID) | ORAL | Status: DC

## 2014-05-30 MED ORDER — HYDROCODONE-ACETAMINOPHEN 5-325 MG PO TABS: 1.0000 | ORAL_TABLET | ORAL | Status: DC | PRN

## 2014-05-30 MED ORDER — CLINDAMYCIN HCL 150 MG PO CAPS: 150.0000 mg | ORAL_CAPSULE | Freq: Four times a day (QID) | ORAL | Status: DC

## 2014-05-30 NOTE — ED Provider Notes (Signed)
Medical screening examination/treatment/procedure(s) were performed by non-physician practitioner and as supervising physician I was immediately available for consultation/collaboration.   EKG Interpretation None       Olivia Mackielga M Maral Lampe, MD 05/30/14 [phone]

## 2014-05-30 NOTE — ED Notes (Signed)
meds given, [facility] eating snack[facility]

## 2014-05-30 NOTE — Discharge Instructions (Signed)
Abscess °An abscess is an infected area that contains a collection of pus and debris. It can occur in almost any part of the body. An abscess is also known as a furuncle or boil. °CAUSES  °An abscess occurs when tissue gets infected. This can occur from blockage of oil or sweat glands, infection of hair follicles, or a minor injury to the skin. As the body tries to fight the infection, pus collects in the area and creates pressure under the skin. This pressure causes pain. People with weakened immune systems have difficulty fighting infections and get certain abscesses more often.  °SYMPTOMS °Usually an abscess develops on the skin and becomes a painful mass that is red, warm, and tender. If the abscess forms under the skin, you may feel a moveable soft area under the skin. Some abscesses break open (rupture) on their own, but most will continue to get worse without care. The infection can spread deeper into the body and eventually into the bloodstream, causing you to feel ill.  °DIAGNOSIS  °Your caregiver will take your medical history and perform a physical exam. A sample of fluid may also be taken from the abscess to determine what is causing your infection. °TREATMENT  °Your caregiver may prescribe antibiotic medicines to fight the infection. However, taking antibiotics alone usually does not cure an abscess. Your caregiver may need to make a small cut (incision) in the abscess to drain the pus. In some cases, gauze is packed into the abscess to reduce pain and to continue draining the area. °HOME CARE INSTRUCTIONS  °· Only take over-the-counter or prescription medicines for pain, discomfort, or fever as directed by your caregiver. °· If you were prescribed antibiotics, take them as directed. Finish them even if you start to feel better. °· If gauze is used, follow your caregiver's directions for changing the gauze. °· To avoid spreading the infection: °· Keep your draining abscess covered with a  bandage. °· Wash your hands well. °· Do not share personal care items, towels, or whirlpools with others. °· Avoid skin contact with others. °· Keep your skin and clothes clean around the abscess. °· Keep all follow-up appointments as directed by your caregiver. °SEEK MEDICAL CARE IF:  °· You have increased pain, swelling, redness, fluid drainage, or bleeding. °· You have muscle aches, chills, or a general ill feeling. °· You have a fever. °MAKE SURE YOU:  °· Understand these instructions. °· Will watch your condition. °· Will get help right away if you are not doing well or get worse. °Document Released: 08/07/2005 Document Revised: 04/28/2012 Document Reviewed: 01/10/2012 °[employer]® Patient Information ©2015 [employer]. This information is not intended to replace advice given to you by your health care provider. Make sure you discuss any questions you have with your health care provider. ° °Abscess °Care After °An abscess (also called a boil or furuncle) is an infected area that contains a collection of pus. Signs and symptoms of an abscess include pain, tenderness, redness, or hardness, or you may feel a moveable soft area under your skin. An abscess can occur anywhere in the body. The infection may spread to surrounding tissues causing cellulitis. A cut (incision) by the surgeon was made over your abscess and the pus was drained out. Gauze may have been packed into the space to provide a drain that will allow the cavity to heal from the inside outwards. The boil may be painful for 5 to 7 days. Most people with a boil do not have   high fevers. Your abscess, if seen early, may not have localized, and may not have been lanced. If not, another appointment may be required for this if it does not get better on its own or with medications. °HOME CARE INSTRUCTIONS  °· Only take over-the-counter or prescription medicines for pain, discomfort, or fever as directed by your caregiver. °· When you bathe, soak and then  remove gauze or iodoform packs at least daily or as directed by your caregiver. You may then wash the wound gently with mild soapy water. Repack with gauze or do as your caregiver directs. °SEEK IMMEDIATE MEDICAL CARE IF:  °· You develop increased pain, swelling, redness, drainage, or bleeding in the wound site. °· You develop signs of generalized infection including muscle aches, chills, fever, or a general ill feeling. °· An oral temperature above 102° F (38.9° C) develops, not controlled by medication. °See your caregiver for a recheck if you develop any of the symptoms described above. If medications (antibiotics) were prescribed, take them as directed. °Document Released: 05/16/2005 Document Revised: 01/20/2012 Document Reviewed: 01/11/2008 °Exit[employer]® Patient Information ©2015 [employer]. This information is not intended to replace advice given to you by your health care provider. Make sure you discuss any questions you have with your health care provider. ° °

## 2016-11-10 ENCOUNTER — Emergency Department (HOSPITAL_COMMUNITY): Payer: Self-pay

## 2016-11-10 ENCOUNTER — Emergency Department (HOSPITAL_COMMUNITY)
Admission: EM | Admit: 2016-11-10 | Discharge: 2016-11-10 | Disposition: A | Discharge status: Home / Self Care | Payer: Self-pay | Attending: Emergency Medicine | Admitting: Emergency Medicine

## 2016-11-10 ENCOUNTER — Encounter (HOSPITAL_COMMUNITY): Payer: Self-pay | Admitting: *Deleted

## 2016-11-10 DIAGNOSIS — S300XXA Contusion of lower back and pelvis, initial encounter: Secondary | ICD-10-CM | POA: Insufficient documentation

## 2016-11-10 DIAGNOSIS — S31819A Unspecified open wound of right buttock, initial encounter: Secondary | ICD-10-CM | POA: Insufficient documentation

## 2016-11-10 DIAGNOSIS — Z87891 Personal history of nicotine dependence: Secondary | ICD-10-CM | POA: Insufficient documentation

## 2016-11-10 DIAGNOSIS — W3400XA Accidental discharge from unspecified firearms or gun, initial encounter: Secondary | ICD-10-CM | POA: Insufficient documentation

## 2016-11-10 DIAGNOSIS — Y929 Unspecified place or not applicable: Secondary | ICD-10-CM | POA: Insufficient documentation

## 2016-11-10 DIAGNOSIS — Y999 Unspecified external cause status: Secondary | ICD-10-CM | POA: Insufficient documentation

## 2016-11-10 DIAGNOSIS — Y939 Activity, unspecified: Secondary | ICD-10-CM | POA: Insufficient documentation

## 2016-11-10 LAB — I-STAT BETA HCG BLOOD, ED (MC, WL, AP ONLY): I-stat hCG, quantitative: <5 m[IU]/mL (ref <5)

## 2016-11-10 MED ORDER — HYDROCODONE-ACETAMINOPHEN 5-325 MG PO TABS: 1.0000 | ORAL_TABLET | ORAL | 0 refills | Status: DC | PRN

## 2016-11-10 MED ORDER — ONDANSETRON 4 MG PO TBDP
4.0000 mg | ORAL_TABLET | Freq: Once | ORAL | Status: AC
  Administered 2016-11-10: 4 mg via ORAL
  Filled 2016-11-10: qty 1

## 2016-11-10 MED ORDER — IBUPROFEN 600 MG PO TABS: 600.0000 mg | ORAL_TABLET | Freq: Four times a day (QID) | ORAL | 0 refills | Status: DC | PRN

## 2016-11-10 MED ORDER — MORPHINE SULFATE (PF) 4 MG/ML IV SOLN
4.0000 mg | Freq: Once | INTRAVENOUS | Status: AC
  Administered 2016-11-10: 4 mg via INTRAMUSCULAR
  Filled 2016-11-10: qty 1

## 2016-11-10 NOTE — ED Notes (Signed)
Taken to xray.
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
## 2016-11-10 NOTE — ED Notes (Signed)
Cleansed and dressed wounds. Patient education provided. Supplies given for drsgs at home.
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
## 2016-11-16 ENCOUNTER — Encounter (HOSPITAL_COMMUNITY): Payer: Self-pay | Admitting: *Deleted

## 2016-11-16 ENCOUNTER — Emergency Department (HOSPITAL_COMMUNITY)
Admission: EM | Admit: 2016-11-16 | Discharge: 2016-11-16 | Disposition: A | Discharge status: Home / Self Care | Payer: Self-pay | Attending: Emergency Medicine | Admitting: Emergency Medicine

## 2016-11-16 DIAGNOSIS — L089 Local infection of the skin and subcutaneous tissue, unspecified: Secondary | ICD-10-CM

## 2016-11-16 DIAGNOSIS — Z23 Encounter for immunization: Secondary | ICD-10-CM | POA: Insufficient documentation

## 2016-11-16 DIAGNOSIS — Z79899 Other long term (current) drug therapy: Secondary | ICD-10-CM | POA: Insufficient documentation

## 2016-11-16 DIAGNOSIS — F1911 Other psychoactive substance abuse, in remission: Secondary | ICD-10-CM

## 2016-11-16 DIAGNOSIS — F17210 Nicotine dependence, cigarettes, uncomplicated: Secondary | ICD-10-CM | POA: Insufficient documentation

## 2016-11-16 DIAGNOSIS — T148XXA Other injury of unspecified body region, initial encounter: Secondary | ICD-10-CM

## 2016-11-16 DIAGNOSIS — T814XXA Infection following a procedure, initial encounter: Secondary | ICD-10-CM | POA: Insufficient documentation

## 2016-11-16 DIAGNOSIS — Y828 Other medical devices associated with adverse incidents: Secondary | ICD-10-CM | POA: Insufficient documentation

## 2016-11-16 DIAGNOSIS — F1011 Alcohol abuse, in remission: Secondary | ICD-10-CM | POA: Insufficient documentation

## 2016-11-16 LAB — COMPREHENSIVE METABOLIC PANEL
ALT: 10 U/L — ABNORMAL LOW (ref 14–54)
AST: 20 U/L (ref 15–41)
Albumin: 3.7 g/dL (ref 3.5–5.0)
Alkaline Phosphatase: 62 U/L (ref 38–126)
Anion gap: 7 (ref 5–15)
BUN: 10 mg/dL (ref 6–20)
CHLORIDE: 108 mmol/L (ref 101–111)
CO2: 23 mmol/L (ref 22–32)
Calcium: 9.2 mg/dL (ref 8.9–10.3)
Creatinine, Ser: 0.87 mg/dL (ref 0.44–1.00)
GFR calc non Af Amer: >60 mL/min (ref >60)
Glucose, Bld: 91 mg/dL (ref 65–99)
Potassium: 4.2 mmol/L (ref 3.5–5.1)
SODIUM: 138 mmol/L (ref 135–145)
Total Bilirubin: 0.5 mg/dL (ref 0.3–1.2)
Total Protein: 6.6 g/dL (ref 6.5–8.1)

## 2016-11-16 LAB — RAPID URINE DRUG SCREEN, HOSP PERFORMED
Amphetamines: NOT DETECTED
BENZODIAZEPINES: NOT DETECTED
Barbiturates: NOT DETECTED
COCAINE: POSITIVE — AB
Opiates: NOT DETECTED
Tetrahydrocannabinol: POSITIVE — AB

## 2016-11-16 LAB — I-STAT BETA HCG BLOOD, ED (MC, WL, AP ONLY): I-stat hCG, quantitative: <5 m[IU]/mL (ref <5)

## 2016-11-16 LAB — CBC
HEMATOCRIT: 40.4 % (ref 36.0–46.0)
Hemoglobin: 13.7 g/dL (ref 12.0–15.0)
MCH: 32.6 pg (ref 26.0–34.0)
MCHC: 33.9 g/dL (ref 30.0–36.0)
MCV: 96.2 fL (ref 78.0–100.0)
Platelets: 387 10*3/uL (ref 150–400)
RBC: 4.2 MIL/uL (ref 3.87–5.11)
RDW: 13 % (ref 11.5–15.5)
WBC: 9.2 10*3/uL (ref 4.0–10.5)

## 2016-11-16 LAB — ETHANOL: Alcohol, Ethyl (B): <5 mg/dL (ref <5)

## 2016-11-16 MED ORDER — TETANUS-DIPHTH-ACELL PERTUSSIS 5-2.5-18.5 LF-MCG/0.5 IM SUSP
0.5000 mL | Freq: Once | INTRAMUSCULAR | Status: AC
  Administered 2016-11-16: 0.5 mL via INTRAMUSCULAR
  Filled 2016-11-16: qty 0.5

## 2016-11-16 MED ORDER — CEPHALEXIN 250 MG PO CAPS
500.0000 mg | ORAL_CAPSULE | Freq: Once | ORAL | Status: AC
  Administered 2016-11-16: 500 mg via ORAL
  Filled 2016-11-16: qty 2

## 2016-11-16 MED ORDER — ACETAMINOPHEN 325 MG PO TABS
650.0000 mg | ORAL_TABLET | Freq: Once | ORAL | Status: AC
  Administered 2016-11-16: 650 mg via ORAL
  Filled 2016-11-16: qty 2

## 2016-11-16 MED ORDER — CEPHALEXIN 500 MG PO CAPS: 500.0000 mg | ORAL_CAPSULE | Freq: Three times a day (TID) | ORAL | 0 refills | Status: DC

## 2016-11-16 NOTE — Discharge Instructions (Signed)
It was our pleasure to provide your ER care today - we hope that you feel better.  Take antibiotic as prescribed.  Keep wound very clean - wash with soap and water 2x/day.  Change  dressing daily.  For alcohol and/or substance use - see resource guide provided for community resources, and outpatient follow up.   Follow up with primary care doctor in the coming week.  Return to ER if worse, new symptoms, spreading redness, increased swelling, fevers, severe pain, other concern.

## 2016-11-16 NOTE — ED Notes (Signed)
Patient states she always wants help with substance abuse.  Normally drinks 1 pint of liquor and 5 16oz beers each day.  Last drink was yesterday.  Last use of crack cocaine yesterday.  Last use of cannabis yesterday.
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
## 2017-03-23 ENCOUNTER — Emergency Department (HOSPITAL_COMMUNITY)
Admission: EM | Admit: 2017-03-23 | Discharge: 2017-03-23 | Disposition: A | Discharge status: Home / Self Care | Payer: Self-pay | Attending: Emergency Medicine | Admitting: Emergency Medicine

## 2017-03-23 ENCOUNTER — Emergency Department (HOSPITAL_COMMUNITY): Payer: Self-pay

## 2017-03-23 ENCOUNTER — Encounter (HOSPITAL_COMMUNITY): Payer: Self-pay | Admitting: Emergency Medicine

## 2017-03-23 DIAGNOSIS — Y939 Activity, unspecified: Secondary | ICD-10-CM | POA: Insufficient documentation

## 2017-03-23 DIAGNOSIS — Y999 Unspecified external cause status: Secondary | ICD-10-CM | POA: Insufficient documentation

## 2017-03-23 DIAGNOSIS — Z79899 Other long term (current) drug therapy: Secondary | ICD-10-CM | POA: Insufficient documentation

## 2017-03-23 DIAGNOSIS — S91312A Laceration without foreign body, left foot, initial encounter: Secondary | ICD-10-CM | POA: Insufficient documentation

## 2017-03-23 DIAGNOSIS — Y929 Unspecified place or not applicable: Secondary | ICD-10-CM | POA: Insufficient documentation

## 2017-03-23 DIAGNOSIS — F17210 Nicotine dependence, cigarettes, uncomplicated: Secondary | ICD-10-CM | POA: Insufficient documentation

## 2017-03-23 DIAGNOSIS — S96909A Unspecified injury of unspecified muscle and tendon at ankle and foot level, unspecified foot, initial encounter: Secondary | ICD-10-CM

## 2017-03-23 DIAGNOSIS — W208XXA Other cause of strike by thrown, projected or falling object, initial encounter: Secondary | ICD-10-CM | POA: Insufficient documentation

## 2017-03-23 MED ORDER — TRAMADOL HCL 50 MG PO TABS: 50.0000 mg | ORAL_TABLET | Freq: Four times a day (QID) | ORAL | 0 refills | Status: DC | PRN

## 2017-03-23 MED ORDER — TETANUS-DIPHTH-ACELL PERTUSSIS 5-2.5-18.5 LF-MCG/0.5 IM SUSP
0.5000 mL | Freq: Once | INTRAMUSCULAR | Status: AC
  Administered 2017-03-23: 0.5 mL via INTRAMUSCULAR
  Filled 2017-03-23: qty 0.5

## 2017-03-23 MED ORDER — LIDOCAINE-EPINEPHRINE (PF) 2 %-1:200000 IJ SOLN
10.0000 mL | Freq: Once | INTRAMUSCULAR | Status: AC
  Administered 2017-03-23: 10 mL
  Filled 2017-03-23: qty 20

## 2017-03-23 MED ORDER — CEPHALEXIN 500 MG PO CAPS: 500.0000 mg | ORAL_CAPSULE | Freq: Four times a day (QID) | ORAL | 0 refills | Status: DC

## 2017-03-23 MED ORDER — OXYCODONE-ACETAMINOPHEN 5-325 MG PO TABS
1.0000 | ORAL_TABLET | Freq: Once | ORAL | Status: AC
  Administered 2017-03-23: 1 via ORAL
  Filled 2017-03-23: qty 1
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
## 2017-03-23 NOTE — ED Notes (Signed)
Paged Podiatry
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
## 2017-03-23 NOTE — ED Notes (Signed)
Gave Pt [place] sandwich and sprite per RN. Pt sitting up and eating comfortably

## 2017-03-23 NOTE — ED Provider Notes (Signed)
[facility]-EMERGENCY DEPT Provider Note   CSN: [id number] Arrival date & time: 03/23/17  0350     History   Chief Complaint Chief Complaint  Patient presents with  . Foot Pain    HPI Jill Zamora is a 26 y.o. female.  Patient presents to the emergency department for evaluation of left foot injury. Patient reports that she dropped a large picture frame onto her left foot just prior to arrival. Patient reports a laceration on the top of her foot and deformity of her toes. She reports that she hasn't really looked at it because she was afraid to. Patient complaining of moderate to severe pain in the foot.      History reviewed. No pertinent past medical history.  There are no active problems to display for this patient.   History reviewed. No pertinent surgical history.  OB History    No data available       Home Medications    Prior to Admission medications   Medication Sig Start Date End Date Taking? Authorizing Provider  cephALEXin (KEFLEX) 500 MG capsule Take 1 capsule (500 mg total) by mouth 4 (four) times daily. 03/23/17   Gilda Crease, MD  clindamycin (CLEOCIN) 150 MG capsule Take 1 capsule (150 mg total) by mouth every 6 (six) hours. Patient not taking: Reported on 11/16/2016 05/30/14   Marlon Pel, PA-C  HYDROcodone-acetaminophen (NORCO/VICODIN) 5-325 MG tablet Take 1 tablet by mouth every 4 (four) hours as needed. Patient not taking: Reported on 11/16/2016 11/10/16   Jacalyn Lefevre, MD  ibuprofen (ADVIL,MOTRIN) 600 MG tablet Take 1 tablet (600 mg total) by mouth every 6 (six) hours as needed. Patient not taking: Reported on 11/16/2016 11/10/16   Jacalyn Lefevre, MD  ondansetron (ZOFRAN) 4 MG tablet Take 1 tablet (4 mg total) by mouth every 6 (six) hours. Patient not taking: Reported on 11/16/2016 05/30/14   Marlon Pel, PA-C  traMADol (ULTRAM) 50 MG tablet Take 1 tablet (50 mg total) by mouth every 6 (six) hours as needed. 03/23/17   Gilda Crease,  MD    Family History No family history on file.  Social History Social History  Substance Use Topics  . Smoking status: Current Every Day Smoker    Packs/day: 1.00    Years: 5.00    Types: Cigarettes, Cigars  . Smokeless tobacco: Never Used  . Alcohol use Yes     Comment: 0.5 pint liquor per day     Allergies   Patient has no known allergies.   Review of Systems Review of Systems  Skin: Positive for wound.  All other systems reviewed and are negative.    Physical Exam Updated Vital Signs BP 109/68   Pulse 79   Temp 99.1 F (37.3 C) (Oral)   Resp 15   LMP 03/04/2017   SpO2 98%   Physical Exam  Constitutional: She appears well-developed and well-nourished.  HENT:  Head: Normocephalic and atraumatic.  Eyes: Pupils are equal, round, and reactive to light.  Neck: Normal range of motion.  Cardiovascular: Normal rate and regular rhythm.   Pulmonary/Chest: Effort normal and breath sounds normal.  Abdominal: Soft.  Musculoskeletal:       Left foot: There is decreased range of motion (4th toe persistently plantar flexed, cannot dorsiflex) and tenderness.  Skin: Laceration (over distal aspect of 4th metatarsal) noted.     ED Treatments / Results  Labs (all labs ordered are listed, but only abnormal results are displayed) Labs Reviewed - No data to  display  EKG  EKG Interpretation None       Radiology Dg Foot Complete Left  Result Date: 03/23/2017 CLINICAL DATA:  L foot pain; dropped heavy mirror on anterior foot; laceration X 4 am 03/23/17 EXAM: LEFT FOOT - COMPLETE 3+ VIEW COMPARISON:  None. FINDINGS: There is no evidence of fracture or dislocation. There is no evidence of arthropathy or other focal bone abnormality. Soft tissues are unremarkable. IMPRESSION: Negative. Electronically Signed   By: Burman Nieves M.D.   On: 03/23/2017 04:58    Procedures .Marland KitchenLaceration Repair Date/Time: 03/23/2017 6:46 AM Performed by: Gilda Crease Authorized  by: Gilda Crease   Consent:    Consent obtained:  Verbal   Consent given by:  Patient Universal protocol:    Procedure explained and questions answered to patient or proxy's satisfaction: yes     Test results available and properly labeled: yes     Imaging studies available: yes     Required blood products, implants, devices, and special equipment available: yes     Site/side marked: yes     Immediately prior to procedure, a time out was called: yes     Patient identity confirmed:  Verbally with patient Anesthesia (see MAR for exact dosages):    Anesthesia method:  Local infiltration   Local anesthetic:  Lidocaine 2% WITH epi Laceration details:    Location:  Foot   Foot location:  Top of L foot   Length (cm):  1.5 Repair type:    Repair type:  Simple Pre-procedure details:    Preparation:  Patient was prepped and draped in usual sterile fashion and imaging obtained to evaluate for foreign bodies Exploration:    Wound exploration: wound explored through full range of motion     Wound extent: tendon damage (complete laceration extensor tendon)     Tendon damage location:  Lower extremity   Tendon damage extent:  Complete transection   Tendon repair plan:  Refer for evaluation   Contaminated: no   Treatment:    Area cleansed with:  Betadine   Amount of cleaning:  Extensive   Irrigation solution:  Sterile saline   Irrigation method:  Syringe   Visualized foreign bodies/material removed: no   Skin repair:    Repair method:  Sutures   Suture size:  4-0   Suture material:  Prolene   Number of sutures:  1 Approximation:    Approximation:  Loose Post-procedure details:    Dressing:  Antibiotic ointment   Patient tolerance of procedure:  Tolerated well, no immediate complications   (including critical care time)  Medications Ordered in ED Medications  Tdap (BOOSTRIX) injection 0.5 mL (0.5 mLs Intramuscular Given 03/23/17 0439)  oxyCODONE-acetaminophen  (PERCOCET/ROXICET) 5-325 MG per tablet 1 tablet (1 tablet Oral Given 03/23/17 0439)  lidocaine-EPINEPHrine (XYLOCAINE W/EPI) 2 %-1:200000 (PF) injection 10 mL (10 mLs Infiltration Given by Other 03/23/17 0522)     Initial Impression / Assessment and Plan / ED Course  I have reviewed the triage vital signs and the nursing notes.  Pertinent labs & imaging results that were available during my care of the patient were reviewed by me and considered in my medical decision making (see chart for details).     Patient presents to the ER for evaluation of left foot injury. Patient dropped a heavy picture frame on her foot causing a laceration on the dorsal aspect of the foot. Examination revealed persistent plantar flexion of the fourth toe with inability to dorsiflex. X-ray  negative. Exploration of the wound reveals completely lacerated extensor tendon. Loose approximation was performed with one suture.  Discussed with Dr. Logan BoresEvans, on-call for to podiatry. He asks for MRI to assist surgical repair. Patient will be discharged, follow-up with him in the office tomorrow or Wednesday.  Final Clinical Impressions(s) / ED Diagnoses   Final diagnoses:  Injury of tendon of foot    New Prescriptions New Prescriptions   CEPHALEXIN (KEFLEX) 500 MG CAPSULE    Take 1 capsule (500 mg total) by mouth 4 (four) times daily.   TRAMADOL (ULTRAM) 50 MG TABLET    Take 1 tablet (50 mg total) by mouth every 6 (six) hours as needed.     Gilda CreasePollina, Christopher J, MD 03/23/17 [phone]

## 2017-03-24 ENCOUNTER — Telehealth: Payer: Self-pay | Admitting: *Deleted

## 2017-03-24 NOTE — Telephone Encounter (Signed)
"  I was supposed to call this morning to schedule surgery but my phone is acting up.  Can you call and schedule surgery with my boyfriend?  I am using his phone.  He's aware that you are going to call.  Please give him a call."

## 2017-04-01 NOTE — Telephone Encounter (Signed)
I attempted to return patient's call.  Her friend stated he would get her to give me a call around noon.  We have not seen this patient.  She probably needs to schedule an appointment for follow-up from her ED visit on 03/23/2017.  Patient saw Dr. Logan BoresEvans in the ER.

## 2017-06-07 ENCOUNTER — Encounter (HOSPITAL_COMMUNITY): Payer: Self-pay

## 2017-06-07 ENCOUNTER — Emergency Department (HOSPITAL_COMMUNITY)
Admission: EM | Admit: 2017-06-07 | Discharge: 2017-06-08 | Disposition: A | Discharge status: Home / Self Care | Payer: Self-pay | Attending: Emergency Medicine | Admitting: Emergency Medicine

## 2017-06-07 ENCOUNTER — Emergency Department (HOSPITAL_COMMUNITY): Payer: Self-pay

## 2017-06-07 DIAGNOSIS — F1014 Alcohol abuse with alcohol-induced mood disorder: Secondary | ICD-10-CM | POA: Diagnosis present

## 2017-06-07 DIAGNOSIS — R45851 Suicidal ideations: Secondary | ICD-10-CM | POA: Insufficient documentation

## 2017-06-07 DIAGNOSIS — R079 Chest pain, unspecified: Secondary | ICD-10-CM | POA: Insufficient documentation

## 2017-06-07 DIAGNOSIS — Z7289 Other problems related to lifestyle: Secondary | ICD-10-CM

## 2017-06-07 DIAGNOSIS — R0789 Other chest pain: Secondary | ICD-10-CM

## 2017-06-07 DIAGNOSIS — F17210 Nicotine dependence, cigarettes, uncomplicated: Secondary | ICD-10-CM | POA: Insufficient documentation

## 2017-06-07 DIAGNOSIS — F1010 Alcohol abuse, uncomplicated: Secondary | ICD-10-CM | POA: Insufficient documentation

## 2017-06-07 DIAGNOSIS — X788XXA Intentional self-harm by other sharp object, initial encounter: Secondary | ICD-10-CM | POA: Insufficient documentation

## 2017-06-07 DIAGNOSIS — F1410 Cocaine abuse, uncomplicated: Secondary | ICD-10-CM | POA: Diagnosis present

## 2017-06-07 DIAGNOSIS — Z79899 Other long term (current) drug therapy: Secondary | ICD-10-CM | POA: Insufficient documentation

## 2017-06-07 LAB — BASIC METABOLIC PANEL
ANION GAP: 14 (ref 5–15)
BUN: 12 mg/dL (ref 6–20)
CO2: 18 mmol/L — ABNORMAL LOW (ref 22–32)
Calcium: 9 mg/dL (ref 8.9–10.3)
Chloride: 108 mmol/L (ref 101–111)
Creatinine, Ser: 1.05 mg/dL — ABNORMAL HIGH (ref 0.44–1.00)
GFR calc Af Amer: >60 mL/min (ref >60)
GFR calc non Af Amer: >60 mL/min (ref >60)
Glucose, Bld: 88 mg/dL (ref 65–99)
POTASSIUM: 3.5 mmol/L (ref 3.5–5.1)
SODIUM: 140 mmol/L (ref 135–145)

## 2017-06-07 LAB — CBC
HEMATOCRIT: 40.8 % (ref 36.0–46.0)
HEMOGLOBIN: 14 g/dL (ref 12.0–15.0)
MCH: 31.7 pg (ref 26.0–34.0)
MCHC: 34.3 g/dL (ref 30.0–36.0)
MCV: 92.5 fL (ref 78.0–100.0)
Platelets: 409 10*3/uL — ABNORMAL HIGH (ref 150–400)
RBC: 4.41 MIL/uL (ref 3.87–5.11)
RDW: 12.9 % (ref 11.5–15.5)
WBC: 12.8 10*3/uL — AB (ref 4.0–10.5)

## 2017-06-07 LAB — SALICYLATE LEVEL: Salicylate Lvl: <7 mg/dL (ref 2.8–30.0)

## 2017-06-07 LAB — I-STAT BETA HCG BLOOD, ED (MC, WL, AP ONLY)

## 2017-06-07 LAB — I-STAT TROPONIN, ED: Troponin i, poc: 0 ng/mL (ref 0.00–0.08)

## 2017-06-07 LAB — ACETAMINOPHEN LEVEL: Acetaminophen (Tylenol), Serum: <10 ug/mL — ABNORMAL LOW (ref 10–30)

## 2017-06-07 LAB — ETHANOL: ALCOHOL ETHYL (B): 136 mg/dL — AB (ref <5)

## 2017-06-07 NOTE — ED Notes (Signed)
Bed: RESB Expected date:  Expected time:  Means of arrival:  Comments: EMS 26 yo female chest pain-12 lead normal

## 2017-06-07 NOTE — ED Triage Notes (Signed)
Patient arrives by EMS with complaints of chest pain, after EMS arrived, patient states central chest pain 8/10-patient then refused transport from EMS-patient signed refusal-[employer] on scene and was talking to patient and patient states she is suicidal and has been drinking and smoking crack today.

## 2017-06-08 DIAGNOSIS — F1014 Alcohol abuse with alcohol-induced mood disorder: Secondary | ICD-10-CM

## 2017-06-08 DIAGNOSIS — F1410 Cocaine abuse, uncomplicated: Secondary | ICD-10-CM | POA: Diagnosis present

## 2017-06-08 HISTORY — DX: Alcohol abuse with alcohol-induced mood disorder: F10.14

## 2017-06-08 HISTORY — DX: Cocaine abuse, uncomplicated: F14.10

## 2017-06-08 LAB — RAPID URINE DRUG SCREEN, HOSP PERFORMED
AMPHETAMINES: NOT DETECTED
BARBITURATES: NOT DETECTED
BENZODIAZEPINES: NOT DETECTED
COCAINE: POSITIVE — AB
Opiates: NOT DETECTED
Tetrahydrocannabinol: POSITIVE — AB

## 2017-06-08 MED ORDER — TETANUS-DIPHTH-ACELL PERTUSSIS 5-2.5-18.5 LF-MCG/0.5 IM SUSP: 0.5000 mL | Freq: Once | INTRAMUSCULAR | Status: DC

## 2017-06-08 MED ORDER — GABAPENTIN 100 MG PO CAPS: 200.0000 mg | ORAL_CAPSULE | Freq: Two times a day (BID) | ORAL | 0 refills | Status: DC

## 2017-06-08 MED ORDER — GABAPENTIN 100 MG PO CAPS: 200.0000 mg | ORAL_CAPSULE | Freq: Two times a day (BID) | ORAL | Status: DC
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
## 2017-06-08 NOTE — ED Provider Notes (Signed)
[facility]-EMERGENCY DEPT Provider Note   CSN: [id number] Arrival date & time: 06/07/17  2251     History   Chief Complaint Chief Complaint  Patient presents with  . Chest Pain  . Suicidal    HPI Jill Zamora is a 26 y.o. female with No major medical problems presents to the Emergency Department with [employer] tonight after being found intoxicated.  Patient had syncopal episode but did not hit her head or fall.  Patient reports that approximately 2 hours prior to arrival she smoked crack cocaine and subsequently developed chest pain. She reports a large volume of EtOH.  Patient reports that she became angry earlier today and began cutting herself.  Patient reports that that she has previously felt suicidal she did not feel suicidal today. [employer] states that patient stated to them that she felt suicidal. Patient denies previous suicide attempt.  Patient denies shortness of breath, abdominal pain, vomiting, diarrhea, weakness, dizziness.     The history is provided by the patient and medical records. No language interpreter was used.    History reviewed. No pertinent past medical history.  There are no active problems to display for this patient.   History reviewed. No pertinent surgical history.  OB History    No data available       Home Medications    Prior to Admission medications   Medication Sig Start Date End Date Taking? Authorizing Provider  cephALEXin (KEFLEX) 500 MG capsule Take 1 capsule (500 mg total) by mouth 4 (four) times daily. Patient not taking: Reported on 06/07/2017 03/23/17   Gilda Crease, MD  HYDROcodone-acetaminophen (NORCO/VICODIN) 5-325 MG tablet Take 1 tablet by mouth every 4 (four) hours as needed. Patient not taking: Reported on 11/16/2016 11/10/16   Jacalyn Lefevre, MD  ibuprofen (ADVIL,MOTRIN) 600 MG tablet Take 1 tablet (600 mg total) by mouth every 6 (six) hours as needed. Patient not taking: Reported on 11/16/2016 11/10/16   Jacalyn Lefevre, MD    ondansetron (ZOFRAN) 4 MG tablet Take 1 tablet (4 mg total) by mouth every 6 (six) hours. Patient not taking: Reported on 11/16/2016 05/30/14   Marlon Pel, PA-C  traMADol (ULTRAM) 50 MG tablet Take 1 tablet (50 mg total) by mouth every 6 (six) hours as needed. Patient not taking: Reported on 06/07/2017 03/23/17   Gilda Crease, MD    Family History No family history on file.  Social History Social History  Substance Use Topics  . Smoking status: Current Every Day Smoker    Packs/day: 1.00    Years: 5.00    Types: Cigarettes, Cigars  . Smokeless tobacco: Never Used  . Alcohol use Yes     Comment: 0.5 pint liquor per day     Allergies   Patient has no known allergies.   Review of Systems Review of Systems  Constitutional: Negative for appetite change, diaphoresis, fatigue, fever and unexpected weight change.  HENT: Negative for mouth sores.   Eyes: Negative for visual disturbance.  Respiratory: Negative for cough, chest tightness, shortness of breath and wheezing.   Cardiovascular: Positive for chest pain.  Gastrointestinal: Negative for abdominal pain, constipation, diarrhea, nausea and vomiting.  Endocrine: Negative for polydipsia, polyphagia and polyuria.  Genitourinary: Negative for dysuria, frequency, hematuria and urgency.  Musculoskeletal: Negative for back pain and neck stiffness.  Skin: Negative for rash.  Allergic/Immunologic: Negative for immunocompromised state.  Neurological: Negative for syncope, light-headedness and headaches.  Hematological: Does not bruise/bleed easily.  Psychiatric/Behavioral: Positive for self-injury. Negative for sleep  disturbance. The patient is nervous/anxious.   All other systems reviewed and are negative.    Physical Exam Updated Vital Signs BP 95/61   Pulse (!) 105   Temp (!) 97.4 F (36.3 C) (Oral)   Resp 20   SpO2 92%   Physical Exam  Constitutional: She appears well-developed and well-nourished. No distress.   Awake, alert, nontoxic appearance Patient is sitting in bed, weeping and sucking her thumb.  HENT:  Head: Normocephalic and atraumatic.  Mouth/Throat: Oropharynx is clear and moist. No oropharyngeal exudate.  Eyes: Right conjunctiva is injected. Left conjunctiva is injected. No scleral icterus.  Neck: Normal range of motion. Neck supple.  Cardiovascular: Regular rhythm and intact distal pulses.  Tachycardia present.   Pulses:      Radial pulses are 2+ on the right side, and 2+ on the left side.  Pulmonary/Chest: Effort normal and breath sounds normal. No respiratory distress. She has no wheezes.  Equal chest expansion  Abdominal: Soft. Bowel sounds are normal. She exhibits no mass. There is no tenderness. There is no rebound and no guarding.  Musculoskeletal: Normal range of motion. She exhibits no edema.  Neurological: She is alert.  Speech is clear and goal oriented Moves extremities without ataxia  Skin: Skin is warm and dry. She is not diaphoretic.  Superficial cuts to the left forearm  Psychiatric: Her mood appears anxious. She is not actively hallucinating. She expresses no homicidal and no suicidal ideation. She expresses no suicidal plans and no homicidal plans.  Patient is extremely anxious.  Nursing note and vitals reviewed.    ED Treatments / Results  Labs (all labs ordered are listed, but only abnormal results are displayed) Labs Reviewed  BASIC METABOLIC PANEL - Abnormal; Notable for the following:       Result Value   CO2 18 (*)    Creatinine, Ser 1.05 (*)    All other components within normal limits  CBC - Abnormal; Notable for the following:    WBC 12.8 (*)    Platelets 409 (*)    All other components within normal limits  ETHANOL - Abnormal; Notable for the following:    Alcohol, Ethyl (B) 136 (*)    All other components within normal limits  ACETAMINOPHEN LEVEL - Abnormal; Notable for the following:    Acetaminophen (Tylenol), Serum <10 (*)    All other  components within normal limits  SALICYLATE LEVEL  RAPID URINE DRUG SCREEN, HOSP PERFORMED  I-STAT TROPONIN, ED  I-STAT BETA HCG BLOOD, ED ([facility], [facility], [facility] ONLY)  I-STAT BETA HCG BLOOD, ED ([facility], [facility], [facility] ONLY)    EKG  EKG Interpretation  Date/Time:  Saturday June 07 2017 23:03:55 EDT Ventricular Rate:  106 PR Interval:    QRS Duration: 95 QT Interval:  351 QTC Calculation: 467 R Axis:   83 Text Interpretation:  Sinus tachycardia Borderline Q waves in inferior leads No old tracing to compare Confirmed by Rolan BuccoBelfi, Trey [phone]([id number]) on 06/07/2017 11:08:35 PM        Procedures Procedures (including critical care time)  Medications Ordered in ED Medications - No data to display   Initial Impression / Assessment and Plan / ED Course  I have reviewed the triage vital signs and the nursing notes.  Pertinent labs & imaging results that were available during my care of the patient were reviewed by me and considered in my medical decision making (see chart for details).     Patient presents with alcohol and cocaine intoxication. She  is adamant about denying suicidal ideation however she did state this to [employer] prior to arrival. Labs are reassuring. Patient is alert and oriented. She is medical cleared for TTS evaluation.  Final Clinical Impressions(s) / ED Diagnoses   Final diagnoses:  Deliberate self-cutting  Other chest pain  Cocaine abuse  Alcohol abuse    New Prescriptions New Prescriptions   No medications on file     Milta DeitersMuthersbaugh, Leiah Giannotti, PA-C 06/08/17 [phone]    Dione BoozeGlick, David, MD 06/08/17 [phone]

## 2017-06-08 NOTE — ED Notes (Signed)
Hourly rounding reveals patient sleeping in room. No complaints, stable, in no acute distress. Q15 minute rounds and monitoring via Security Cameras to continue. 

## 2017-06-08 NOTE — Discharge Instructions (Signed)
For your ongoing mental health needs, you are advised to follow up [facility]. ° °[facility] °[address] °[place], [place] [address] °[phone] °New patients are seen at the walk-in clinic every Tuesday from 9:00 am - 12:00 pm. °

## 2017-06-08 NOTE — [facility] Suicide Risk Assessment (Signed)
Suicide Risk Assessment  Discharge Assessment   [facility]   Principal Problem: Alcohol abuse with alcohol-induced mood disorder [facility](HCC) Discharge Diagnoses:  Patient Active Problem List   Diagnosis Date Noted  . Alcohol abuse with alcohol-induced mood disorder (HCC) [F10.14] 06/08/2017    Priority: High  . Cocaine abuse [F14.10] 06/08/2017    Priority: High    Total Time spent with patient: 45 minutes  Musculoskeletal: Strength & Muscle Tone: within normal limits Gait & Station: normal Patient leans: N/A  Psychiatric Specialty Exam:   Blood pressure 113/66, pulse 88, temperature 98 F (36.7 C), temperature source Oral, resp. rate 18, SpO2 97 %.There is no height or weight on file to calculate BMI.  General Appearance: Casual  Eye Contact::  Good  Speech:  Normal Rate409  Volume:  Normal  Mood:  Euthymic  Affect:  Congruent  Thought Process:  Coherent and Descriptions of Associations: Intact  Orientation:  Full (Time, Place, and Person)  Thought Content:  WDL and Logical  Suicidal Thoughts:  No  Homicidal Thoughts:  No  Memory:  Immediate;   Good Recent;   Good Remote;   Good  Judgement:  Fair  Insight:  Fair  Psychomotor Activity:  Normal  Concentration:  Good  Recall:  Good  Fund of Knowledge:Fair  Language: Good  Akathisia:  No  Handed:  Right  AIMS (if indicated):     Assets:  Housing Intimacy Leisure Time Physical Health Resilience Social Support  Sleep:     Cognition: WNL  ADL's:  Intact   Mental Status Per Nursing Assessment::   On Admission:   Alcohol and cocaine abuse with self harm behavior of superficially cutting her forearm (barely visible).  Denies this was a suicide attempt.  No suicidal/homicidal ideations, hallucinations, or withdrawal symptoms.  Interested in rehab, resources provided along with ADS information.  Lives with her boyfriend who is supportive and does not use.  Demographic Factors:   Caucasian  Loss Factors: NA  Historical Factors: NA  Risk Reduction Factors:   Sense of responsibility to family, Living with another person, especially a relative and Positive social support  Continued Clinical Symptoms:  None  Cognitive Features That Contribute To Risk:  None    Suicide Risk:  Minimal: No identifiable suicidal ideation.  Patients presenting with no risk factors but with morbid ruminations; may be classified as minimal risk based on the severity of the depressive symptoms    Plan Of Care/Follow-up recommendations:  Activity:  as tolerated Diet:  heart healhty diet  Stryder Poitra, NP 06/08/2017, 10:13 AM

## 2017-06-08 NOTE — ED Notes (Signed)
Pt. Transferred to [facility] from ED to room 37 after screening for contraband. Report to include Situation, Background, Assessment and Recommendations from [facility] RN. Pt. Oriented to unit including Q15 minute rounds as well as the security cameras for their protection. Patient is alert and oriented, warm and dry in no acute distress. Patient denies SI, HI, and AVH. Pt. Encouraged to let me know if needs arise.

## 2017-06-08 NOTE — Progress Notes (Signed)
Per DNP Lord, patient interested in substance abuse treatment options. CSW spoke with patient at bedside regarding substance abuse treatment options. Patient reported that she is interested in residential treatment. CSW provided patient with resources and informed patient about self-pay. CSW encouraged patient to contact [facility] to inquire about scheduling an appointment. CSW inquired if patient had any questions, patient reported none. CSW encouraged patient to follow up with resources provided.   Celso SickleKimberly Naija Troost, LCSWA [facility]  Clinical Social Worker [phone]

## 2017-06-08 NOTE — ED Notes (Signed)
Pt sleeping at present, no distress noted, calm & cooperative.  A&O x 3,.  Monitoring for safety, Q 15 min checks in effect.

## 2017-06-08 NOTE — Progress Notes (Signed)
TTS ordered at 00:37, cancelled by EDP and ordered again at 01:24   Princess BruinsAquicha Daniyal Tabor, MSW, [facility] LcCSWA TTS Specialist [phone]

## 2017-06-08 NOTE — ED Notes (Signed)
Hourly rounding reveals patient in room. No complaints, stable, in no acute distress. Q15 minute rounds and monitoring via Security Cameras to continue. 

## 2017-06-08 NOTE — Progress Notes (Signed)
TTS attempted to assess pt. Pt is not currently in her room. [facility] TTS to complete assessment once the pt has been transferred.   Princess BruinsAquicha Han Lysne, MSW, LCSWA TTS Specialist [phone]

## 2017-06-08 NOTE — BH Assessment (Addendum)
Tele Assessment Note   Jill Zamora is an 26 y.o. female who presents to the ED voluntarily BIB [facility] initially due to chest pains, however pt reported to EMS that she was experiencing SI in the past and engaged in self-harming PTA. Pt reports she got into an argument with her boyfriend which prompted her to cut herself. Pt has visible cuts on her arms. Pt states she occasionally cuts when she is upset and she does this in order to make herself feel better. Pt denies that she was attempting suicide by cutting herself, however she reports she attempted suicide when she was 87 or 26 years of age. Pt also reports she has pending legal charges due to driving without a license and having an open container in the vehicle. Pt endorses that she has been uses cocaine and alcohol daily in order to cope with her depression. Pt reports her sleeping habits have been inconsistent due to increased drug use. Pt reports she can stay awake for several days at a time while she is binging on cocaine and alcohol. Pt denies any current providers at this time and states she received inpt treatment in [place] when she was a teenager due to the suicide attempt.  Per Nira Conn, NP pt will need an AM psych eval. [name], RN notified of the recommendation. EDP Dr. Preston Fleeting, MD notified.  Diagnosis: MDD, single episode, w/o psychotic features; Cocaine Use D/O; Alcohol use D/O  Past Medical History: History reviewed. No pertinent past medical history.  History reviewed. No pertinent surgical history.  Family History: No family history on file.  Social History:  reports that she has been smoking Cigarettes and Cigars.  She has a 5.00 pack-year smoking history. She has never used smokeless tobacco. She reports that she drinks alcohol. She reports that she uses drugs, including Cocaine and Marijuana.  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  General Assessment Data [facility] of Assessment: [facility] TTS Assessment: In system Is this a Tele or Face-to-Face Assessment?: Face-to-Face Is this an Initial Assessment or a Re-assessment for this encounter?: Initial Assessment Marital status: Single Is patient pregnant?: No Pregnancy Status: No Living Arrangements: Spouse/significant other Can pt return to current living arrangement?: Yes Admission Status: Voluntary Is patient capable of signing voluntary admission?: Yes Referral Source: Self/Family/Friend Insurance type: none     Crisis Care Plan Living Arrangements: Spouse/significant other Name of Psychiatrist: none Name of Therapist: none  Education Status Is patient currently in school?: No Highest grade of school patient has completed: 10th  Risk to self with the past 6 months Suicidal Ideation: No Has patient been a risk to self within the past 6 months prior to admission? :  Yes Suicidal Intent: No Has patient had any suicidal intent within the past 6 months prior to admission? : No Is patient at risk for suicide?: Yes Suicidal Plan?: No Has patient had any suicidal plan within the past 6 months prior to admission? : No Access to Means: No What has been your use of drugs/alcohol within the last 12  months?: reports to daily alcohol and cocaine use  Previous Attempts/Gestures: Yes How many times?: 1 Triggers for Past Attempts: Unknown Intentional Self Injurious Behavior: Cutting Comment - Self Injurious Behavior: pt reports she intentionally cuts her arms when she is angry  Family Suicide History: No Recent stressful life event(s): Conflict (Comment), Legal Issues (relationship issues ) Persecutory voices/beliefs?: No Depression: Yes Depression Symptoms: Feeling worthless/self pity, Feeling angry/irritable, Isolating, Guilt Substance abuse history and/or treatment for substance abuse?: No Suicide prevention information given to non-admitted patients: Not applicable  Risk to Others within the past 6 months Homicidal Ideation: No Does patient have any lifetime risk of violence toward others beyond the six months prior to admission? : No Thoughts of Harm to Others: No Current Homicidal Intent: No Current Homicidal Plan: No Access to Homicidal Means: No History of harm to others?: No Assessment of Violence: None Noted Does patient have access to weapons?: No Criminal Charges Pending?: Yes Describe Pending Criminal Charges: driving without a license; open container  Does patient have a court date: Yes Court Date:  (unknown) Is patient on probation?: No  Psychosis Hallucinations: None noted Delusions: None noted  Mental Status Report Appearance/Hygiene: In scrubs Eye Contact: Good Motor Activity: Freedom of movement Speech: Logical/coherent Level of Consciousness: Alert Mood: Depressed, Worthless, low self-esteem Affect: Depressed Anxiety Level: None Thought Processes: Coherent, Relevant Judgement: Impaired Orientation: Person, Place, Time, Situation, Appropriate for developmental age Obsessive Compulsive Thoughts/Behaviors: None  Cognitive Functioning Concentration: Normal Memory: Remote Intact, Recent Intact IQ: Average Insight: Poor Impulse Control:  Poor Appetite: Good Sleep: Decreased Total Hours of Sleep:  (varies) Vegetative Symptoms: None  ADLScreening [facility] Assessment Services) Patient's cognitive ability adequate to safely complete daily activities?: Yes Patient able to express need for assistance with ADLs?: Yes Independently performs ADLs?: Yes (appropriate for developmental age)  Prior Inpatient Therapy Prior Inpatient Therapy: No  Prior Outpatient Therapy Prior Outpatient Therapy: No Does patient have an ACCT team?: No Does patient have Intensive In-House Services?  : No Does patient have [facility] services? : No Does patient have P4CC services?: No  ADL Screening (condition at time of admission) Patient's cognitive ability adequate to safely complete daily activities?: Yes Is the patient deaf or have difficulty hearing?: No Does the patient have difficulty seeing, even when wearing glasses/contacts?: No Does the patient have difficulty concentrating, remembering, or making decisions?: No Patient able to express need for assistance with ADLs?: Yes Does the patient have difficulty dressing or bathing?: No Independently performs ADLs?: Yes (appropriate for developmental age) Does the patient have difficulty walking or climbing stairs?: No Weakness of Legs: None Weakness of Arms/Hands: None  Home Assistive Devices/Equipment Home Assistive Devices/Equipment: None    Abuse/Neglect Assessment (Assessment to be complete while patient is alone) Physical Abuse: Denies Verbal Abuse: Denies Sexual Abuse: Denies Exploitation of patient/patient's resources: Denies Self-Neglect: Denies     Merchant navy officerAdvance Directives (For Healthcare) Does Patient Have a Medical Advance Directive?: No Would patient like information on creating a medical advance directive?: No - Patient declined    Additional Information 1:1 In Past 12 Months?: No CIRT Risk: No Elopement Risk: No Does patient have medical  clearance?: Yes     Disposition:   Disposition Initial Assessment Completed for this Encounter: Yes Disposition of Patient: Other dispositions Other disposition(s): Other (Comment) (AM psych eval per Nira ConnJason Berry, NP)  Karolee OhsAquicha R Laneta Guerin 06/08/2017 2:47 AM

## 2017-11-25 ENCOUNTER — Other Ambulatory Visit: Payer: Self-pay

## 2017-11-25 ENCOUNTER — Emergency Department (HOSPITAL_COMMUNITY)
Admission: EM | Admit: 2017-11-25 | Discharge: 2017-11-25 | Disposition: A | Discharge status: Home / Self Care | Payer: Self-pay

## 2018-02-03 ENCOUNTER — Encounter (HOSPITAL_COMMUNITY): Payer: Self-pay

## 2018-02-03 ENCOUNTER — Inpatient Hospital Stay (HOSPITAL_COMMUNITY)
Admission: AD | Admit: 2018-02-03 | Payer: Federal, State, Local not specified - Other | Source: Intra-hospital | Admitting: Psychiatry

## 2018-02-03 ENCOUNTER — Emergency Department (HOSPITAL_COMMUNITY)
Admission: EM | Admit: 2018-02-03 | Discharge: 2018-02-03 | Disposition: A | Discharge status: Home / Self Care | Payer: Self-pay | Attending: Emergency Medicine | Admitting: Emergency Medicine

## 2018-02-03 ENCOUNTER — Other Ambulatory Visit: Payer: Self-pay

## 2018-02-03 DIAGNOSIS — Z79899 Other long term (current) drug therapy: Secondary | ICD-10-CM | POA: Insufficient documentation

## 2018-02-03 DIAGNOSIS — R45851 Suicidal ideations: Secondary | ICD-10-CM | POA: Insufficient documentation

## 2018-02-03 DIAGNOSIS — F1420 Cocaine dependence, uncomplicated: Secondary | ICD-10-CM | POA: Insufficient documentation

## 2018-02-03 DIAGNOSIS — F17210 Nicotine dependence, cigarettes, uncomplicated: Secondary | ICD-10-CM | POA: Insufficient documentation

## 2018-02-03 DIAGNOSIS — F322 Major depressive disorder, single episode, severe without psychotic features: Secondary | ICD-10-CM | POA: Insufficient documentation

## 2018-02-03 DIAGNOSIS — F1910 Other psychoactive substance abuse, uncomplicated: Secondary | ICD-10-CM

## 2018-02-03 HISTORY — DX: Major depressive disorder, single episode, unspecified: F32.9

## 2018-02-03 HISTORY — DX: Depression, unspecified: F32.A

## 2018-02-03 HISTORY — DX: Anxiety disorder, unspecified: F41.9

## 2018-02-03 LAB — CBC
HCT: 40 % (ref 36.0–46.0)
Hemoglobin: 12.8 g/dL (ref 12.0–15.0)
MCH: 30.4 pg (ref 26.0–34.0)
MCHC: 32 g/dL (ref 30.0–36.0)
MCV: 95 fL (ref 78.0–100.0)
PLATELETS: 340 10*3/uL (ref 150–400)
RBC: 4.21 MIL/uL (ref 3.87–5.11)
RDW: 14.2 % (ref 11.5–15.5)
WBC: 8.6 10*3/uL (ref 4.0–10.5)

## 2018-02-03 LAB — COMPREHENSIVE METABOLIC PANEL
ALT: 26 U/L (ref 14–54)
ANION GAP: 9 (ref 5–15)
AST: 24 U/L (ref 15–41)
Albumin: 3.9 g/dL (ref 3.5–5.0)
Alkaline Phosphatase: 83 U/L (ref 38–126)
BUN: 5 mg/dL — ABNORMAL LOW (ref 6–20)
CALCIUM: 8.9 mg/dL (ref 8.9–10.3)
CO2: 24 mmol/L (ref 22–32)
Chloride: 104 mmol/L (ref 101–111)
Creatinine, Ser: 0.82 mg/dL (ref 0.44–1.00)
GFR calc non Af Amer: >60 mL/min (ref >60)
Glucose, Bld: 85 mg/dL (ref 65–99)
Potassium: 3.7 mmol/L (ref 3.5–5.1)
SODIUM: 137 mmol/L (ref 135–145)
Total Bilirubin: 0.5 mg/dL (ref 0.3–1.2)
Total Protein: 7 g/dL (ref 6.5–8.1)

## 2018-02-03 LAB — I-STAT BETA HCG BLOOD, ED (MC, WL, AP ONLY): I-stat hCG, quantitative: <5 m[IU]/mL (ref <5)

## 2018-02-03 LAB — RAPID URINE DRUG SCREEN, HOSP PERFORMED
Amphetamines: NOT DETECTED
Barbiturates: NOT DETECTED
Benzodiazepines: NOT DETECTED
COCAINE: POSITIVE — AB
OPIATES: NOT DETECTED
TETRAHYDROCANNABINOL: POSITIVE — AB

## 2018-02-03 LAB — ETHANOL: Alcohol, Ethyl (B): <10 mg/dL (ref <10)

## 2018-02-03 LAB — ACETAMINOPHEN LEVEL

## 2018-02-03 LAB — SALICYLATE LEVEL

## 2018-02-03 LAB — CBG MONITORING, ED: Glucose-Capillary: 86 mg/dL (ref 65–99)

## 2018-02-03 MED ORDER — LORAZEPAM 1 MG PO TABS: 0.0000 mg | ORAL_TABLET | Freq: Four times a day (QID) | ORAL | Status: DC

## 2018-02-03 MED ORDER — THIAMINE HCL 100 MG/ML IJ SOLN: 100.0000 mg | Freq: Every day | INTRAMUSCULAR | Status: DC

## 2018-02-03 MED ORDER — LORAZEPAM 1 MG PO TABS: 0.0000 mg | ORAL_TABLET | Freq: Two times a day (BID) | ORAL | Status: DC

## 2018-02-03 MED ORDER — VITAMIN B-1 100 MG PO TABS
100.0000 mg | ORAL_TABLET | Freq: Every day | ORAL | Status: DC
  Administered 2018-02-03: 100 mg via ORAL
  Filled 2018-02-03: qty 1

## 2018-02-03 MED ORDER — LORAZEPAM 2 MG/ML IJ SOLN: 0.0000 mg | Freq: Two times a day (BID) | INTRAMUSCULAR | Status: DC

## 2018-02-03 MED ORDER — LORAZEPAM 2 MG/ML IJ SOLN: 0.0000 mg | Freq: Four times a day (QID) | INTRAMUSCULAR | Status: DC

## 2018-02-03 NOTE — ED Notes (Signed)
Regular Diet ordered for Dinner. 

## 2018-02-03 NOTE — BH Assessment (Signed)
Tele Assessment Note   Patient Name: Jill PhenixMelanie Zamora MRN: [id number] Referring Physician: Benjiman CoreNathan Pickering, MD Location of Patient: [facility] Location of Provider: Behavioral Health TTS Department  Jill PhenixMelanie Zamora is an 27 y.o. [facility] and cutting behaviors. Pt has hx of depression and severe SA with cocaine. Pt reports she get high and "I do something stupid and I argue with my boyfriend and then I cut and have thoughts of harming myself". Pt reports she does 1/2 a gram of cocaine twice a week. Pt denies homicidal thoughts or physical aggression. Pt denies having access to firearms. Pt has court date on 4/25 for a hit and run charge. Pt denies hallucinations. Pt does not appear to be responding to internal stimuli and exhibits no delusional thought. Pt's reality testing appears to be intact. Pt is unemployed and lives with husband and his son. Pt received outpatient services for trying to hang herself when she was an adolescent.   Pt is dressed in scrubs, alert, oriented x4 with normal speech and normal motor behavior. Eye contact is good and Pt is pleasant. Pt's mood is depressed and affect is congruent. Thought process is coherent and relevant. Pt's insight is poor and judgement is partial. There is no indication Pt is currently responding to internal stimuli or experiencing delusional thought content. Pt was cooperative throughout assessment.      Diagnosis: F32.2 Major depressive disorder, Single episode, Severe F14.20 Cocaine use disorder, Severe   Past Medical History:  Past Medical History:  Diagnosis Date  . Anxiety   . Depression     History reviewed. No pertinent surgical history.  Family History: History reviewed. No pertinent family history.  Social History:  reports that she has been smoking cigarettes.  She has a 10.00 pack-year smoking history. She has never used smokeless tobacco. She reports that she drinks alcohol. She reports that she has current or  past drug history. Drugs: Cocaine and Marijuana.  Additional Social History:  Alcohol / Drug Use Pain Medications: See MAR Prescriptions: See MAR Over the Counter: See MAR History of alcohol / drug use?: Yes Longest period of sobriety (when/how long): Ongoing Negative Consequences of Use: Financial, Legal, Personal relationships, Work / School Substance #1 Name of Substance 1: Cocaine 1 - Age of First Use: 21 1 - Amount (size/oz): 1/2 gram 1 - Frequency: twice weekly 1 - Duration: ongoing 1 - Last Use / Amount: 02/03/18  CIWA: CIWA-Ar BP: 109/78 Pulse Rate: 98 Nausea and Vomiting: no nausea and no vomiting Tactile Disturbances: none Tremor: no tremor Auditory Disturbances: not present Paroxysmal Sweats: no sweat visible Visual Disturbances: not present Anxiety: no anxiety, at ease Headache, Fullness in Head: none present Agitation: normal activity Orientation and Clouding of Sensorium: oriented and can do serial additions CIWA-Ar Total: 0 COWS:    Allergies: No Known Allergies  Home Medications:  (Not in a hospital admission)  OB/GYN Status:  Patient's last menstrual period was 01/05/2018.  General Assessment Data [facility] Assessment: In system Is this a Tele or Face-to-Face Assessment?: Tele Assessment Is this an Initial Assessment or a Re-assessment for this encounter?: Initial Assessment Marital status: Single Is patient pregnant?: Unknown Pregnancy Status: Unknown Living Arrangements: Spouse/significant other Can pt return to current living arrangement?: Yes Admission Status: Voluntary Is patient capable of signing voluntary admission?: Yes Referral Source: Self/Family/Friend Insurance type: None     Crisis Care Plan Living Arrangements: Spouse/significant other Name of Psychiatrist: None Name of Therapist: None  Education Status Is patient currently in school?: No Is the patient employed, unemployed or receiving disability?:  Unemployed  Risk to self with the past 6 months Suicidal Ideation: Yes-Currently Present Has patient been a risk to self within the past 6 months prior to admission? : No Suicidal Intent: No Has patient had any suicidal intent within the past 6 months prior to admission? : No Is patient at risk for suicide?: No Suicidal Plan?: No Has patient had any suicidal plan within the past 6 months prior to admission? : No Access to Means: Yes Specify Access to Suicidal Means: Cocaine What has been your use of drugs/alcohol within the last 12 months?: Cocaine Previous Attempts/Gestures: Yes How many times?: 1 Triggers for Past Attempts: Unknown Intentional Self Injurious Behavior: Cutting Family Suicide History: No Recent stressful life event(s): Conflict (Comment), Legal Issues, Financial Problems Persecutory voices/beliefs?: No Depression: Yes Depression Symptoms: Feeling worthless/self pity Substance abuse history and/or treatment for substance abuse?: Yes Suicide prevention information given to non-admitted patients: Not applicable  Risk to Others within the past 6 months Homicidal Ideation: No Does patient have any lifetime risk of violence toward others beyond the six months prior to admission? : No Thoughts of Harm to Others: No Current Homicidal Intent: No Current Homicidal Plan: No Access to Homicidal Means: No History of harm to others?: No Assessment of Violence: None Noted Does patient have access to weapons?: No Criminal Charges Pending?: Yes Describe Pending Criminal Charges: Hit and Run  Does patient have a court date: Yes Court Date: 03/05/18 Is patient on probation?: No  Psychosis Hallucinations: None noted Delusions: None noted  Mental Status Report Appearance/Hygiene: In scrubs Eye Contact: Good Motor Activity: Freedom of movement Speech: Logical/coherent Level of Consciousness: Alert Mood: Pleasant Affect: Appropriate to circumstance Anxiety Level:  None Thought Processes: Coherent, Relevant Judgement: Partial Orientation: Person, Place, Time, Situation, Appropriate for developmental age Obsessive Compulsive Thoughts/Behaviors: None  Cognitive Functioning Concentration: Normal Memory: Recent Intact Is patient IDD: No Is patient DD?: No Insight: Poor Impulse Control: Poor Appetite: Good Have you had any weight changes? : Gain Sleep: Increased Total Hours of Sleep: 15 Vegetative Symptoms: Staying in bed  ADLScreening [facility] Assessment Services) Patient's cognitive ability adequate to safely complete daily activities?: Yes Patient able to express need for assistance with ADLs?: Yes Independently performs ADLs?: Yes (appropriate for developmental age)  Prior Inpatient Therapy Prior Inpatient Therapy: No  Prior Outpatient Therapy Prior Outpatient Therapy: Yes Prior Therapy Dates: 2014 Prior Therapy Facilty/Provider(s): [facility] Reason for Treatment: SI Does patient have an ACCT team?: No Does patient have Intensive In-House Services?  : No Does patient have [facility] services? : No Does patient have P4CC services?: No  ADL Screening (condition at time of admission) Patient's cognitive ability adequate to safely complete daily activities?: Yes Is the patient deaf or have difficulty hearing?: No Does the patient have difficulty seeing, even when wearing glasses/contacts?: No Does the patient have difficulty concentrating, remembering, or making decisions?: No Patient able to express need for assistance with ADLs?: Yes Does the patient have difficulty dressing or bathing?: No Independently performs ADLs?: Yes (appropriate for developmental age) Does the patient have difficulty walking or climbing stairs?: No Weakness of Legs: None Weakness of Arms/Hands: None  Home Assistive Devices/Equipment Home Assistive Devices/Equipment: None  Therapy Consults (therapy consults require a physician order) PT Evaluation Needed: No OT  Evalulation Needed: No SLP Evaluation Needed: No Abuse/Neglect Assessment (Assessment to be complete while patient is alone) Abuse/Neglect Assessment Can Be Completed:  Yes Physical Abuse: Denies Verbal Abuse: Yes, present (Comment) Sexual Abuse: Denies Exploitation of patient/patient's resources: Denies Self-Neglect: Denies Values / Beliefs Cultural Requests During Hospitalization: None Spiritual Requests During Hospitalization: None Consults Spiritual Care Consult Needed: No Social Work Consult Needed: No Merchant navy officer (For Healthcare) Does Patient Have a Medical Advance Directive?: No Would patient like information on creating a medical advance directive?: No - Patient declined    Additional Information 1:1 In Past 12 Months?: No CIRT Risk: No Elopement Risk: No Does patient have medical clearance?: Yes     Disposition:  Disposition Initial Assessment Completed for this Encounter: Yes Disposition of Patient: Admit Type of inpatient treatment program: Adult Patient refused recommended treatment: No Mode of transportation if patient is discharged?: N/A Patient referred to: Other (Comment)(Accepted to [facility] 407-2)   Per Leighton Ruff, NP pt meets inpatient criteria. Pt accepted to [facility] 407-2.   This service was provided via telemedicine using a 2-way, interactive audio and video technology.  Names of all persons participating in this telemedicine service and their role in this encounter. Name: Denine Brotz Role: Pt  Name: Danae Orleans, [place], [place] Role: Therapeutic Triage Specialist  Name:  Role:   Name:  Role:     Danae Orleans 02/03/2018 5:15 PM

## 2018-02-03 NOTE — ED Notes (Signed)
Pt notified she needs to provide a urine sample. Pt verbalized understanding.

## 2018-02-03 NOTE — ED Notes (Signed)
PT PLAced in room 10 and she voided ua to lab

## 2018-02-03 NOTE — ED Provider Notes (Addendum)
[facility] EMERGENCY DEPARTMENT Provider Note   CSN: [id number] Arrival date & time: 02/03/18  1246     History   Chief Complaint Chief Complaint  Patient presents with  . Suicidal    HPI Jill Zamora is a 27 y.o. female.  HPI Patient presents with substance abuse and suicidal thoughts.  Has cocaine abuse.  States that she smokes it.  Last smoked this morning.  Also states she smokes marijuana and has suicidal thoughts.  No active plan.  Has been seen for this in the past.  Not currently on any medicines for it.  States she is here today because she has been using more and her boyfriend had a fight with her and states that she needed to get help.  Denies hallucinations.  Also drinks heavily and states she drinks around 4 half pints a day. Past Medical History:  Diagnosis Date  . Anxiety   . Depression     Patient Active Problem List   Diagnosis Date Noted  . Alcohol abuse with alcohol-induced mood disorder (HCC) 06/08/2017  . Cocaine abuse (HCC) 06/08/2017    History reviewed. No pertinent surgical history.   OB History   None      Home Medications    Prior to Admission medications   Medication Sig Start Date End Date Taking? Authorizing Provider  gabapentin (NEURONTIN) 100 MG capsule Take 2 capsules (200 mg total) by mouth 2 (two) times daily. 06/08/17   Charm Rings, NP    Family History History reviewed. No pertinent family history.  Social History Social History   Tobacco Use  . Smoking status: Current Every Day Smoker    Packs/day: 2.00    Years: 5.00    Pack years: 10.00    Types: Cigarettes  . Smokeless tobacco: Never Used  Substance Use Topics  . Alcohol use: Yes    Comment: 5 or 6 drinks per day  . Drug use: Yes    Types: Cocaine, Marijuana    Comment: last cocaine use this morning 02/03/18     Allergies   Patient has no known allergies.   Review of Systems Review of Systems  Constitutional: Negative for  appetite change.  HENT: Negative for congestion.   Respiratory: Negative for shortness of breath.   Cardiovascular: Negative for chest pain.  Gastrointestinal: Negative for abdominal pain.  Genitourinary: Negative for flank pain.  Musculoskeletal: Negative for back pain.  Skin: Negative for rash.  Neurological: Negative for syncope.  Psychiatric/Behavioral: Positive for suicidal ideas.     Physical Exam Updated Vital Signs BP 109/78   Pulse 98   Temp 98 F (36.7 C) (Oral)   Resp 16   Ht 5\' 6"  (1.676 m)   Wt 86.2 kg (190 lb)   LMP 01/05/2018   SpO2 97%   BMI 30.67 kg/m   Physical Exam  Constitutional: She appears well-developed.  Patient appears older than stated age.  HENT:  Head: Atraumatic.  Eyes: EOM are normal.  Neck: Neck supple.  Cardiovascular: Normal rate.  Pulmonary/Chest: Effort normal.  Abdominal: Soft.  Musculoskeletal: She exhibits no edema.  Neurological: She is alert.  Skin: Skin is warm. Capillary refill takes less than 2 seconds.  Psychiatric: Her behavior is normal.     ED Treatments / Results  Labs (all labs ordered are listed, but only abnormal results are displayed) Labs Reviewed  COMPREHENSIVE METABOLIC PANEL - Abnormal; Notable for the following components:      Result Value  BUN 5 (*)    All other components within normal limits  ACETAMINOPHEN LEVEL - Abnormal; Notable for the following components:   Acetaminophen (Tylenol), Serum <10 (*)    All other components within normal limits  RAPID URINE DRUG SCREEN, HOSP PERFORMED - Abnormal; Notable for the following components:   Cocaine POSITIVE (*)    Tetrahydrocannabinol POSITIVE (*)    All other components within normal limits  ETHANOL  SALICYLATE LEVEL  CBC  CBG MONITORING, ED  I-STAT BETA HCG BLOOD, ED ([facility], [facility], [facility] ONLY)    EKG None  Radiology No results found.  Procedures Procedures (including critical care time)  Medications Ordered in ED Medications  LORazepam  (ATIVAN) injection 0-4 mg (0 mg Intravenous Not Given 02/03/18 1554)    Or  LORazepam (ATIVAN) tablet 0-4 mg ( Oral See Alternative 02/03/18 1554)  LORazepam (ATIVAN) injection 0-4 mg (has no administration in time range)    Or  LORazepam (ATIVAN) tablet 0-4 mg (has no administration in time range)  thiamine (VITAMIN B-1) tablet 100 mg (100 mg Oral Given 02/03/18 1557)    Or  thiamine (B-1) injection 100 mg ( Intravenous See Alternative 02/03/18 1557)     Initial Impression / Assessment and Plan / ED Course  I have reviewed the triage vital signs and the nursing notes.  Pertinent labs & imaging results that were available during my care of the patient were reviewed by me and considered in my medical decision making (see chart for details).     Patient presents with substance use and depression.  Some suicidal thoughts but no active plan.  Does drink heavily.  Medically cleared at this time.  Will have patient seen by TTS.  Patient been seen by TTS and they recommended inpatient treatment, however patient was reportedly not willing to stay for that.  I do not think she meets criteria for involuntary commitment at this time.  Left AGAINST MEDICAL ADVICE but was discharged  Final Clinical Impressions(s) / ED Diagnoses   Final diagnoses:  Polysubstance abuse [facility](HCC)  Suicidal ideation    ED Discharge Orders    None       Benjiman CorePickering, Yajahira Tison, MD 02/03/18 1536    Benjiman CorePickering, Christien Frankl, MD 02/03/18 [phone]

## 2018-02-03 NOTE — ED Notes (Signed)
Dinner meal given.  Pt [facility], pt reports she does not want inpatient treatment.  [facility], reports her provider recommends inpatient but will allow pt to sign out AMA.

## 2018-02-03 NOTE — ED Triage Notes (Signed)
Pt endorses SI without a plan that began this morning. Pt was on seroquel for anxiety/depression until 5 years ago and pt has been "unstable" since. VSS. Denies hearing voices or hallucinations.

## 2018-02-03 NOTE — Discharge Instructions (Signed)
Follow up with the resources you have been given. °

## 2018-06-15 ENCOUNTER — Inpatient Hospital Stay (HOSPITAL_COMMUNITY)
Admission: EM | Admit: 2018-06-15 | Discharge: 2018-06-18 | DRG: 208 | Discharge status: Against Medical Advice | Payer: Self-pay | Attending: Family Medicine | Admitting: Family Medicine

## 2018-06-15 ENCOUNTER — Emergency Department (HOSPITAL_COMMUNITY): Payer: Self-pay

## 2018-06-15 ENCOUNTER — Encounter (HOSPITAL_COMMUNITY): Payer: Self-pay | Admitting: Emergency Medicine

## 2018-06-15 DIAGNOSIS — E872 Acidosis: Secondary | ICD-10-CM | POA: Diagnosis present

## 2018-06-15 DIAGNOSIS — F419 Anxiety disorder, unspecified: Secondary | ICD-10-CM | POA: Diagnosis present

## 2018-06-15 DIAGNOSIS — R451 Restlessness and agitation: Secondary | ICD-10-CM | POA: Diagnosis not present

## 2018-06-15 DIAGNOSIS — S80211A Abrasion, right knee, initial encounter: Secondary | ICD-10-CM | POA: Diagnosis present

## 2018-06-15 DIAGNOSIS — J9602 Acute respiratory failure with hypercapnia: Secondary | ICD-10-CM | POA: Diagnosis present

## 2018-06-15 DIAGNOSIS — S80212A Abrasion, left knee, initial encounter: Secondary | ICD-10-CM | POA: Diagnosis present

## 2018-06-15 DIAGNOSIS — S01511A Laceration without foreign body of lip, initial encounter: Secondary | ICD-10-CM | POA: Diagnosis present

## 2018-06-15 DIAGNOSIS — G92 Toxic encephalopathy: Secondary | ICD-10-CM | POA: Diagnosis present

## 2018-06-15 DIAGNOSIS — F10929 Alcohol use, unspecified with intoxication, unspecified: Secondary | ICD-10-CM | POA: Diagnosis present

## 2018-06-15 DIAGNOSIS — F17210 Nicotine dependence, cigarettes, uncomplicated: Secondary | ICD-10-CM | POA: Diagnosis present

## 2018-06-15 DIAGNOSIS — N179 Acute kidney failure, unspecified: Secondary | ICD-10-CM | POA: Diagnosis not present

## 2018-06-15 DIAGNOSIS — T07XXXA Unspecified multiple injuries, initial encounter: Secondary | ICD-10-CM

## 2018-06-15 DIAGNOSIS — Z5321 Procedure and treatment not carried out due to patient leaving prior to being seen by health care provider: Secondary | ICD-10-CM | POA: Diagnosis not present

## 2018-06-15 DIAGNOSIS — R4182 Altered mental status, unspecified: Secondary | ICD-10-CM

## 2018-06-15 DIAGNOSIS — J9601 Acute respiratory failure with hypoxia: Secondary | ICD-10-CM | POA: Diagnosis present

## 2018-06-15 DIAGNOSIS — F10129 Alcohol abuse with intoxication, unspecified: Secondary | ICD-10-CM | POA: Diagnosis present

## 2018-06-15 DIAGNOSIS — F1410 Cocaine abuse, uncomplicated: Secondary | ICD-10-CM | POA: Diagnosis present

## 2018-06-15 DIAGNOSIS — F329 Major depressive disorder, single episode, unspecified: Secondary | ICD-10-CM | POA: Diagnosis present

## 2018-06-15 DIAGNOSIS — J690 Pneumonitis due to inhalation of food and vomit: Principal | ICD-10-CM | POA: Diagnosis present

## 2018-06-15 DIAGNOSIS — Z9289 Personal history of other medical treatment: Secondary | ICD-10-CM

## 2018-06-15 DIAGNOSIS — S90812A Abrasion, left foot, initial encounter: Secondary | ICD-10-CM | POA: Diagnosis present

## 2018-06-15 DIAGNOSIS — F1210 Cannabis abuse, uncomplicated: Secondary | ICD-10-CM | POA: Diagnosis present

## 2018-06-15 DIAGNOSIS — Y908 Blood alcohol level of 240 mg/100 ml or more: Secondary | ICD-10-CM | POA: Diagnosis present

## 2018-06-15 DIAGNOSIS — S90811A Abrasion, right foot, initial encounter: Secondary | ICD-10-CM | POA: Diagnosis present

## 2018-06-15 DIAGNOSIS — G9340 Encephalopathy, unspecified: Secondary | ICD-10-CM | POA: Diagnosis present

## 2018-06-15 LAB — CBG MONITORING, ED: Glucose-Capillary: 124 mg/dL — ABNORMAL HIGH (ref 70–99)

## 2018-06-15 LAB — CBC
HCT: 43.3 % (ref 36.0–46.0)
Hemoglobin: 14.4 g/dL (ref 12.0–15.0)
MCH: 31.6 pg (ref 26.0–34.0)
MCHC: 33.3 g/dL (ref 30.0–36.0)
MCV: 95 fL (ref 78.0–100.0)
Platelets: 340 10*3/uL (ref 150–400)
RBC: 4.56 MIL/uL (ref 3.87–5.11)
RDW: 14.4 % (ref 11.5–15.5)
WBC: 15.2 10*3/uL — ABNORMAL HIGH (ref 4.0–10.5)

## 2018-06-15 MED ORDER — PROPOFOL 1000 MG/100ML IV EMUL
INTRAVENOUS | Status: AC
  Administered 2018-06-16: 60 ug/kg/min via INTRAVENOUS
  Filled 2018-06-15: qty 100

## 2018-06-15 MED ORDER — MIDAZOLAM HCL 2 MG/2ML IJ SOLN
INTRAMUSCULAR | Status: AC
  Filled 2018-06-15: qty 4

## 2018-06-15 MED ORDER — PROPOFOL 1000 MG/100ML IV EMUL
5.0000 ug/kg/min | Freq: Once | INTRAVENOUS | Status: AC
  Administered 2018-06-16: 60 ug/kg/min via INTRAVENOUS

## 2018-06-15 MED ORDER — ETOMIDATE 2 MG/ML IV SOLN
20.0000 mg | Freq: Once | INTRAVENOUS | Status: AC
  Administered 2018-06-15: 20 mg via INTRAVENOUS

## 2018-06-15 MED ORDER — SUCCINYLCHOLINE CHLORIDE 20 MG/ML IJ SOLN
120.0000 mg | Freq: Once | INTRAMUSCULAR | Status: AC
  Administered 2018-06-15: 120 mg via INTRAVENOUS
  Filled 2018-06-15: qty 6
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
## 2018-06-15 NOTE — ED Notes (Signed)
Bed: RESA Expected date:  Expected time:  Means of arrival:  Comments: EMS 27 yo [facility]-intoxication-narcan administration-but no overdose

## 2018-06-15 NOTE — ED Notes (Signed)
EDP at bedside for intubation for airway protection
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
## 2018-06-15 NOTE — ED Provider Notes (Signed)
Procedure Name: Intubation Date/Time: 06/15/2018 11:51 PM Performed by: Cristina GongHammond, Daelin Haste W, PA-C Pre-anesthesia Checklist: Patient identified, Emergency Drugs available, Suction available, Patient being monitored and Timeout performed Oxygen Delivery Method: Non-rebreather mask Preoxygenation: Pre-oxygenation with 100% oxygen Induction Type: Rapid sequence and Cricoid Pressure applied Ventilation: Mask ventilation without difficulty Laryngoscope Size: Glidescope and 4 Grade View: Grade II Tube size: 7.5 mm Number of attempts: 1 Airway Equipment and Method: Video-laryngoscopy Placement Confirmation: ETT inserted through vocal cords under direct vision,  CO2 detector and Breath sounds checked- equal and bilateral Secured at: 24 cm Tube secured with: ETT holder Dental Injury: Teeth and Oropharynx as per pre-operative assessment  Difficulty Due To: Difficult Airway- due to large tongue and Difficult Airway- due to anterior larynx      Oral assessment prior to intubation showed a laceration to the right upper lip mucosa.  Approximately 1.5 cm in right intra-oral.  This was present prior to intubation.  Oral exam unchanged after intubation.    Cristina GongHammond, Martese Vanatta W, PA-C 06/15/18 2357    Terrilee FilesButler, Michael C, MD 06/16/18 [phone]

## 2018-06-15 NOTE — ED Notes (Signed)
Called Resp @2321 

## 2018-06-16 ENCOUNTER — Emergency Department (HOSPITAL_COMMUNITY): Payer: Self-pay

## 2018-06-16 DIAGNOSIS — J9602 Acute respiratory failure with hypercapnia: Secondary | ICD-10-CM

## 2018-06-16 DIAGNOSIS — F10920 Alcohol use, unspecified with intoxication, uncomplicated: Secondary | ICD-10-CM

## 2018-06-16 DIAGNOSIS — G9340 Encephalopathy, unspecified: Secondary | ICD-10-CM

## 2018-06-16 DIAGNOSIS — J690 Pneumonitis due to inhalation of food and vomit: Principal | ICD-10-CM

## 2018-06-16 DIAGNOSIS — J9601 Acute respiratory failure with hypoxia: Secondary | ICD-10-CM

## 2018-06-16 DIAGNOSIS — F10929 Alcohol use, unspecified with intoxication, unspecified: Secondary | ICD-10-CM | POA: Diagnosis present

## 2018-06-16 LAB — BASIC METABOLIC PANEL
Anion gap: 11 (ref 5–15)
BUN: 6 mg/dL (ref 6–20)
CO2: 22 mmol/L (ref 22–32)
CREATININE: 0.78 mg/dL (ref 0.44–1.00)
Calcium: 7.7 mg/dL — ABNORMAL LOW (ref 8.9–10.3)
Chloride: 110 mmol/L (ref 98–111)
GFR calc Af Amer: >60 mL/min (ref >60)
GLUCOSE: 98 mg/dL (ref 70–99)
POTASSIUM: 3.6 mmol/L (ref 3.5–5.1)
Sodium: 143 mmol/L (ref 135–145)

## 2018-06-16 LAB — COMPREHENSIVE METABOLIC PANEL
ALT: 54 U/L — ABNORMAL HIGH (ref 0–44)
ANION GAP: 14 (ref 5–15)
AST: 78 U/L — AB (ref 15–41)
Albumin: 3.7 g/dL (ref 3.5–5.0)
Alkaline Phosphatase: 57 U/L (ref 38–126)
BUN: 7 mg/dL (ref 6–20)
CHLORIDE: 109 mmol/L (ref 98–111)
CO2: 22 mmol/L (ref 22–32)
Calcium: 8.8 mg/dL — ABNORMAL LOW (ref 8.9–10.3)
Creatinine, Ser: 1.03 mg/dL — ABNORMAL HIGH (ref 0.44–1.00)
GFR calc Af Amer: >60 mL/min (ref >60)
GFR calc non Af Amer: >60 mL/min (ref >60)
Glucose, Bld: 117 mg/dL — ABNORMAL HIGH (ref 70–99)
POTASSIUM: 3.9 mmol/L (ref 3.5–5.1)
SODIUM: 145 mmol/L (ref 135–145)
Total Bilirubin: 0.6 mg/dL (ref 0.3–1.2)
Total Protein: 6.6 g/dL (ref 6.5–8.1)

## 2018-06-16 LAB — URINALYSIS, ROUTINE W REFLEX MICROSCOPIC
BILIRUBIN URINE: NEGATIVE
GLUCOSE, UA: NEGATIVE mg/dL
HGB URINE DIPSTICK: NEGATIVE
Ketones, ur: NEGATIVE mg/dL
Leukocytes, UA: NEGATIVE
NITRITE: NEGATIVE
PH: 6 (ref 5.0–8.0)
Protein, ur: NEGATIVE mg/dL
SPECIFIC GRAVITY, URINE: 1.012 (ref 1.005–1.030)

## 2018-06-16 LAB — RAPID URINE DRUG SCREEN, HOSP PERFORMED
AMPHETAMINES: NOT DETECTED
Barbiturates: NOT DETECTED
Benzodiazepines: NOT DETECTED
COCAINE: POSITIVE — AB
OPIATES: NOT DETECTED
Tetrahydrocannabinol: POSITIVE — AB

## 2018-06-16 LAB — CBC
HCT: 37.6 % (ref 36.0–46.0)
Hemoglobin: 12 g/dL (ref 12.0–15.0)
MCH: 31 pg (ref 26.0–34.0)
MCHC: 31.9 g/dL (ref 30.0–36.0)
MCV: 97.2 fL (ref 78.0–100.0)
PLATELETS: 266 10*3/uL (ref 150–400)
RBC: 3.87 MIL/uL (ref 3.87–5.11)
RDW: 14.2 % (ref 11.5–15.5)
WBC: 13.1 10*3/uL — ABNORMAL HIGH (ref 4.0–10.5)

## 2018-06-16 LAB — BLOOD GAS, ARTERIAL
Acid-base deficit: 5.5 mmol/L — ABNORMAL HIGH (ref 0.0–2.0)
Bicarbonate: 21 mmol/L (ref 20.0–28.0)
DRAWN BY: 232811
FIO2: 70
O2 Saturation: 96.4 %
PEEP: 5 cmH2O
PH ART: 7.299 — AB (ref 7.350–7.450)
Patient temperature: 35.1
RATE: 18 resp/min
VT: 470 mL
pCO2 arterial: 42.8 mmHg (ref 32.0–48.0)
pO2, Arterial: 97 mmHg (ref 83.0–108.0)

## 2018-06-16 LAB — GLUCOSE, CAPILLARY
GLUCOSE-CAPILLARY: 105 mg/dL — AB (ref 70–99)
GLUCOSE-CAPILLARY: 89 mg/dL (ref 70–99)
GLUCOSE-CAPILLARY: 95 mg/dL (ref 70–99)
Glucose-Capillary: 82 mg/dL (ref 70–99)
Glucose-Capillary: 81 mg/dL (ref 70–99)

## 2018-06-16 LAB — POCT I-STAT 3, ART BLOOD GAS (G3+)
ACID-BASE DEFICIT: 4 mmol/L — AB (ref 0.0–2.0)
BICARBONATE: 22.2 mmol/L (ref 20.0–28.0)
O2 Saturation: 100 %
PH ART: 7.318 — AB (ref 7.350–7.450)
Patient temperature: 35.9
TCO2: 24 mmol/L (ref 22–32)
pCO2 arterial: 42.7 mmHg (ref 32.0–48.0)
pO2, Arterial: 232 mmHg — ABNORMAL HIGH (ref 83.0–108.0)

## 2018-06-16 LAB — PHOSPHORUS: Phosphorus: 4.3 mg/dL (ref 2.5–4.6)

## 2018-06-16 LAB — LACTIC ACID, PLASMA
LACTIC ACID, VENOUS: 2.3 mmol/L — AB (ref 0.5–1.9)
LACTIC ACID, VENOUS: 2.1 mmol/L — AB (ref 0.5–1.9)
Lactic Acid, Venous: 1.9 mmol/L (ref 0.5–1.9)
Lactic Acid, Venous: 1.7 mmol/L (ref 0.5–1.9)

## 2018-06-16 LAB — I-STAT BETA HCG BLOOD, ED (MC, WL, AP ONLY): I-stat hCG, quantitative: <5 m[IU]/mL (ref <5)

## 2018-06-16 LAB — ETHANOL: Alcohol, Ethyl (B): 243 mg/dL — ABNORMAL HIGH (ref <10)

## 2018-06-16 LAB — MRSA PCR SCREENING: MRSA by PCR: NEGATIVE

## 2018-06-16 LAB — SALICYLATE LEVEL: Salicylate Lvl: <7 mg/dL (ref 2.8–30.0)

## 2018-06-16 LAB — MAGNESIUM: Magnesium: 1.9 mg/dL (ref 1.7–2.4)

## 2018-06-16 LAB — PROCALCITONIN

## 2018-06-16 LAB — HIV ANTIBODY (ROUTINE TESTING W REFLEX): HIV Screen 4th Generation wRfx: NONREACTIVE

## 2018-06-16 LAB — ACETAMINOPHEN LEVEL

## 2018-06-16 MED ORDER — FENTANYL 2500MCG IN NS 250ML (10MCG/ML) PREMIX INFUSION
25.0000 ug/h | INTRAVENOUS | Status: DC
  Administered 2018-06-16 (×2): 150 ug/h via INTRAVENOUS
  Filled 2018-06-16: qty 250

## 2018-06-16 MED ORDER — THIAMINE HCL 100 MG/ML IJ SOLN
100.0000 mg | Freq: Every day | INTRAMUSCULAR | Status: DC
  Administered 2018-06-16 – 2018-06-18 (×3): 100 mg via INTRAVENOUS
  Filled 2018-06-16 (×3): qty 2

## 2018-06-16 MED ORDER — MIDAZOLAM BOLUS VIA INFUSION
1.0000 mg | INTRAVENOUS | Status: DC | PRN
  Filled 2018-06-16: qty 2

## 2018-06-16 MED ORDER — FENTANYL CITRATE (PF) 100 MCG/2ML IJ SOLN: 50.0000 ug | Freq: Once | INTRAMUSCULAR | Status: DC

## 2018-06-16 MED ORDER — MIDAZOLAM HCL 2 MG/2ML IJ SOLN
2.0000 mg | Freq: Once | INTRAMUSCULAR | Status: AC
Start: 2018-06-16 — End: 2018-06-16
  Administered 2018-06-16: 2 mg via INTRAVENOUS

## 2018-06-16 MED ORDER — SUCCINYLCHOLINE CHLORIDE 20 MG/ML IJ SOLN
100.0000 mg | Freq: Once | INTRAMUSCULAR | Status: AC | PRN
  Administered 2018-06-16: 1 mg via INTRAVENOUS
  Filled 2018-06-16: qty 5

## 2018-06-16 MED ORDER — SODIUM CHLORIDE 0.9 % IV SOLN
INTRAVENOUS | Status: DC
  Administered 2018-06-16: 02:00:00 via INTRAVENOUS
  Administered 2018-06-17: 100 mL/h via INTRAVENOUS

## 2018-06-16 MED ORDER — DEXMEDETOMIDINE HCL IN NACL 400 MCG/100ML IV SOLN
0.4000 ug/kg/h | INTRAVENOUS | Status: DC
  Administered 2018-06-16: 0.4 ug/kg/h via INTRAVENOUS
  Administered 2018-06-16 – 2018-06-17 (×2): 0.6 ug/kg/h via INTRAVENOUS
  Filled 2018-06-16 (×3): qty 100

## 2018-06-16 MED ORDER — PIPERACILLIN-TAZOBACTAM 3.375 G IVPB: 3.3750 g | INTRAVENOUS | Status: AC

## 2018-06-16 MED ORDER — VANCOMYCIN HCL 10 G IV SOLR
2000.0000 mg | Freq: Once | INTRAVENOUS | Status: AC
  Administered 2018-06-16: 2000 mg via INTRAVENOUS
  Filled 2018-06-16: qty 2000

## 2018-06-16 MED ORDER — SODIUM CHLORIDE 0.9 % IV BOLUS
500.0000 mL | Freq: Once | INTRAVENOUS | Status: AC
  Administered 2018-06-16: 500 mL via INTRAVENOUS

## 2018-06-16 MED ORDER — ONDANSETRON HCL 4 MG/2ML IJ SOLN
4.0000 mg | Freq: Once | INTRAMUSCULAR | Status: AC
  Administered 2018-06-15: 4 mg via INTRAVENOUS

## 2018-06-16 MED ORDER — MIDAZOLAM HCL 2 MG/2ML IJ SOLN
2.0000 mg | Freq: Once | INTRAMUSCULAR | Status: AC
  Administered 2018-06-15: 2 mg via INTRAVENOUS

## 2018-06-16 MED ORDER — SODIUM CHLORIDE 0.9 % IV SOLN
0.0000 mg/h | INTRAVENOUS | Status: DC
  Administered 2018-06-16 (×2): 4 mg/h via INTRAVENOUS
  Filled 2018-06-16: qty 10

## 2018-06-16 MED ORDER — SODIUM CHLORIDE 0.9 % IV SOLN
INTRAVENOUS | Status: DC | PRN
  Administered 2018-06-16: 1000 mL via INTRAVENOUS

## 2018-06-16 MED ORDER — HEPARIN SODIUM (PORCINE) 5000 UNIT/ML IJ SOLN
5000.0000 [IU] | Freq: Three times a day (TID) | INTRAMUSCULAR | Status: DC
  Administered 2018-06-16 – 2018-06-18 (×5): 5000 [IU] via SUBCUTANEOUS
  Filled 2018-06-16 (×5): qty 1

## 2018-06-16 MED ORDER — LACTATED RINGERS IV BOLUS
1000.0000 mL | Freq: Once | INTRAVENOUS | Status: AC
  Administered 2018-06-16: 1000 mL via INTRAVENOUS

## 2018-06-16 MED ORDER — FOLIC ACID 5 MG/ML IJ SOLN
1.0000 mg | Freq: Every day | INTRAMUSCULAR | Status: DC
Start: 2018-06-16 — End: 2018-06-18
  Administered 2018-06-16 – 2018-06-18 (×3): 1 mg via INTRAVENOUS
  Filled 2018-06-16 (×3): qty 0.2

## 2018-06-16 MED ORDER — FENTANYL 2500MCG IN NS 250ML (10MCG/ML) PREMIX INFUSION
0.0000 ug/h | INTRAVENOUS | Status: DC
  Administered 2018-06-16: 25 ug/h via INTRAVENOUS
  Filled 2018-06-16: qty 250

## 2018-06-16 MED ORDER — ONDANSETRON HCL 4 MG/2ML IJ SOLN
4.0000 mg | Freq: Four times a day (QID) | INTRAMUSCULAR | Status: DC | PRN
Start: 2018-06-16 — End: 2018-06-18
  Administered 2018-06-16 – 2018-06-17 (×2): 4 mg via INTRAVENOUS
  Filled 2018-06-16 (×2): qty 2

## 2018-06-16 MED ORDER — MIDAZOLAM BOLUS VIA INFUSION: 1.0000 mg | INTRAVENOUS | Status: DC | PRN

## 2018-06-16 MED ORDER — PIPERACILLIN-TAZOBACTAM 3.375 G IVPB
3.3750 g | Freq: Three times a day (TID) | INTRAVENOUS | Status: DC
  Administered 2018-06-16: 3.375 g via INTRAVENOUS
  Filled 2018-06-16 (×4): qty 50

## 2018-06-16 MED ORDER — SODIUM CHLORIDE 0.9 % IV SOLN: 250.0000 mL | INTRAVENOUS | Status: DC | PRN

## 2018-06-16 MED ORDER — FENTANYL BOLUS VIA INFUSION
50.0000 ug | INTRAVENOUS | Status: DC | PRN
  Filled 2018-06-16: qty 100

## 2018-06-16 MED ORDER — FAMOTIDINE IN NACL 20-0.9 MG/50ML-% IV SOLN
20.0000 mg | Freq: Two times a day (BID) | INTRAVENOUS | Status: DC
  Administered 2018-06-16 – 2018-06-17 (×3): 20 mg via INTRAVENOUS
  Filled 2018-06-16 (×3): qty 50

## 2018-06-16 MED ORDER — SODIUM CHLORIDE 0.9 % IV SOLN
0.5000 mg/h | INTRAVENOUS | Status: DC
  Administered 2018-06-16: 0.5 mg/h via INTRAVENOUS
  Filled 2018-06-16: qty 10

## 2018-06-16 MED ORDER — VANCOMYCIN HCL IN DEXTROSE 750-5 MG/150ML-% IV SOLN: 750.0000 mg | Freq: Two times a day (BID) | INTRAVENOUS | Status: DC

## 2018-06-16 MED ORDER — FENTANYL BOLUS VIA INFUSION: 50.0000 ug | INTRAVENOUS | Status: DC | PRN

## 2018-06-16 MED ORDER — SODIUM CHLORIDE 0.9 % IV SOLN
3.0000 g | Freq: Three times a day (TID) | INTRAVENOUS | Status: DC
  Administered 2018-06-16 – 2018-06-18 (×7): 3 g via INTRAVENOUS
  Filled 2018-06-16 (×10): qty 3

## 2018-06-16 NOTE — ED Notes (Signed)
Returned from CT.

## 2018-06-16 NOTE — ED Notes (Signed)
Patient acutely agitated and biting ETT-Versed and Fentanyl bolus and drips increased

## 2018-06-16 NOTE — ED Notes (Signed)
[facility] arrived to patient bedside, sedation being titrated by transport team for transport at this time
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
## 2018-06-16 NOTE — ED Notes (Signed)
Spoke to dr. Judd Lienelo, will switch to fent/versed for sedation as pt remains agitated, biting at tube

## 2018-06-16 NOTE — Progress Notes (Signed)
RT pulled EET tube from 24 to 22 cm per CCM request.Patient tolerated well.

## 2018-06-16 NOTE — Progress Notes (Signed)
CRITICAL VALUE ALERT  Critical Value:  Latic Acid 2.1  Date & Time Notied:  06/16/2018-1130  Provider Notified:CC NP/Simpson  Orders Received/Actions taken: None
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
## 2018-06-16 NOTE — ED Notes (Signed)
Patient awake and agitated-biting ETT and pulling at lines-safety restraints in place-Versed and Fentanyl drips increased

## 2018-06-16 NOTE — Progress Notes (Signed)
Pharmacy Antibiotic Note  Jill PhenixMelanie Zamora is a 27 y.o. female found unresponsive admitted on 06/15/2018 with pneumonia.  Pharmacy has been consulted for zosyn dosing.  Plan: Zosyn 3.375g IV q8h (4 hour infusion).  F/u cultures/levels/scr  Height: 5\' 6"  (167.6 cm) Weight: 190 lb (86.2 kg) IBW/kg (Calculated) : 59.3  Temp (24hrs), Avg:95.3 F (35.2 C), Min:95.2 F (35.1 C), Max:95.4 F (35.2 C)  Recent Labs  Lab 06/15/18 2327  WBC 15.2*  CREATININE 1.03*    Estimated Creatinine Clearance: 90.8 mL/min (A) (by C-G formula based on SCr of 1.03 mg/dL (H)).    No Known Allergies  Antimicrobials this admission: 8/6 zosyn >>  8/6 vancomycin >> x1 Dose adjustments this admission:   Microbiology results:  BCx:   UCx:    Sputum:    MRSA PCR:   Thank you for allowing pharmacy to be a part of this patient's care.  Lorenza EvangelistGreen, Liberato Stansbery R 06/16/2018 1:50 AM

## 2018-06-16 NOTE — ED Notes (Signed)
Additional bag of versed called to pharmacy for transport. Receiving RN made aware of departure from unit. [facility] took Succycholine for transport if needed. None was administered prior to transport.
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
## 2018-06-16 NOTE — Progress Notes (Signed)
Pt transported from ED to CT and back, on vent 100% fio2.  Pt tolerated transport well without incident. 

## 2018-06-16 NOTE — Progress Notes (Signed)
Pt aggressive verbally towards staff. Insisting that she is dying because she is thirsty. RN offered oral care and cold mouth swabs. RN offered patient ice chips. Pt consumed ice chips and vomited a large amount of bile substance 5 mins after. RN educated patient on the importance of keeping her NPO tonight. Patient not accepting. Elink paged. Suggested to increase Precedex gtt. PRN zofran ordered and given.   Patient then let side rail down and got out of bed. Bed alarm alerted staff. Patient stated she was trying to get cords off from under her. Educated on the need to use call light and to not get out of bed without assistance. Sitter ordered. Sitter now at bedside.   Genelle Balameron D Jamin Panther, RN

## 2018-06-16 NOTE — Procedures (Signed)
Extubation Procedure Note  Patient Details:   Name: Jill PhenixMelanie Zamora DOB: 01/23/1991 MRN: [id number]   Airway Documentation:    Vent end date: 06/16/18 Vent end time: 1455   Evaluation  O2 sats: currently acceptable Complications: No apparent complications Patient did tolerate procedure well. Bilateral Breath Sounds: Clear, Diminished   Yes   Pt extubated to 3L [place], increased to 5L [place] due to decreased SPo2. Pt had positive cuff leak prior to extubation and has strong non-productive cough and is able to speak post extubation. No complications, no stridor noted. VS within normal limits. RT will continue to monitor.   Carolan ShiverKelley, Leiann Sporer M 06/16/2018, 3:27 PM
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
## 2018-06-17 ENCOUNTER — Inpatient Hospital Stay (HOSPITAL_COMMUNITY): Payer: Self-pay

## 2018-06-17 LAB — POCT I-STAT 3, ART BLOOD GAS (G3+)
ACID-BASE DEFICIT: 2 mmol/L (ref 0.0–2.0)
Bicarbonate: 24 mmol/L (ref 20.0–28.0)
O2 Saturation: 97 %
PH ART: 7.318 — AB (ref 7.350–7.450)
TCO2: 25 mmol/L (ref 22–32)
pCO2 arterial: 47.1 mmHg (ref 32.0–48.0)
pO2, Arterial: 99 mmHg (ref 83.0–108.0)

## 2018-06-17 LAB — CBC
HEMATOCRIT: 35.8 % — AB (ref 36.0–46.0)
HEMOGLOBIN: 11.4 g/dL — AB (ref 12.0–15.0)
MCH: 31.1 pg (ref 26.0–34.0)
MCHC: 31.8 g/dL (ref 30.0–36.0)
MCV: 97.8 fL (ref 78.0–100.0)
Platelets: 235 10*3/uL (ref 150–400)
RBC: 3.66 MIL/uL — ABNORMAL LOW (ref 3.87–5.11)
RDW: 13.7 % (ref 11.5–15.5)
WBC: 10.8 10*3/uL — ABNORMAL HIGH (ref 4.0–10.5)

## 2018-06-17 LAB — RENAL FUNCTION PANEL
ALBUMIN: 2.6 g/dL — AB (ref 3.5–5.0)
ANION GAP: 8 (ref 5–15)
BUN: 7 mg/dL (ref 6–20)
CO2: 26 mmol/L (ref 22–32)
Calcium: 7.7 mg/dL — ABNORMAL LOW (ref 8.9–10.3)
Chloride: 109 mmol/L (ref 98–111)
Creatinine, Ser: 1.02 mg/dL — ABNORMAL HIGH (ref 0.44–1.00)
GFR calc Af Amer: >60 mL/min (ref >60)
GFR calc non Af Amer: >60 mL/min (ref >60)
Glucose, Bld: 108 mg/dL — ABNORMAL HIGH (ref 70–99)
POTASSIUM: 3.5 mmol/L (ref 3.5–5.1)
Phosphorus: 2.3 mg/dL — ABNORMAL LOW (ref 2.5–4.6)
SODIUM: 143 mmol/L (ref 135–145)

## 2018-06-17 LAB — PROCALCITONIN: Procalcitonin: <0.1 ng/mL

## 2018-06-17 LAB — HEMOGLOBIN AND HEMATOCRIT, BLOOD
HCT: 35.2 % — ABNORMAL LOW (ref 36.0–46.0)
Hemoglobin: 11.4 g/dL — ABNORMAL LOW (ref 12.0–15.0)

## 2018-06-17 LAB — GLUCOSE, CAPILLARY
GLUCOSE-CAPILLARY: 87 mg/dL (ref 70–99)
GLUCOSE-CAPILLARY: 74 mg/dL (ref 70–99)
Glucose-Capillary: 97 mg/dL (ref 70–99)
Glucose-Capillary: 90 mg/dL (ref 70–99)

## 2018-06-17 LAB — MAGNESIUM: Magnesium: 2 mg/dL (ref 1.7–2.4)

## 2018-06-17 MED ORDER — PANTOPRAZOLE SODIUM 40 MG PO TBEC
40.0000 mg | DELAYED_RELEASE_TABLET | Freq: Two times a day (BID) | ORAL | Status: DC
  Administered 2018-06-17 – 2018-06-18 (×2): 40 mg via ORAL
  Filled 2018-06-17 (×2): qty 1

## 2018-06-17 MED ORDER — ORAL CARE MOUTH RINSE
15.0000 mL | Freq: Two times a day (BID) | OROMUCOSAL | Status: DC
  Administered 2018-06-17: 15 mL via OROMUCOSAL

## 2018-06-17 MED ORDER — CHLORHEXIDINE GLUCONATE 0.12 % MT SOLN
15.0000 mL | Freq: Two times a day (BID) | OROMUCOSAL | Status: DC
  Administered 2018-06-17: 15 mL via OROMUCOSAL

## 2018-06-17 MED ORDER — ALBUTEROL SULFATE (2.5 MG/3ML) 0.083% IN NEBU
2.5000 mg | INHALATION_SOLUTION | RESPIRATORY_TRACT | Status: DC | PRN
  Administered 2018-06-17: 2.5 mg via RESPIRATORY_TRACT
  Filled 2018-06-17: qty 3

## 2018-06-17 MED ORDER — POTASSIUM PHOSPHATES 15 MMOLE/5ML IV SOLN
30.0000 mmol | Freq: Once | INTRAVENOUS | Status: AC
  Administered 2018-06-17: 30 mmol via INTRAVENOUS
  Filled 2018-06-17: qty 10

## 2018-06-17 NOTE — Progress Notes (Signed)
eLink Physician-Brief Progress Note Patient Name: Jill PhenixMelanie Thuman DOB: 02/19/1991 MRN: [id number]   Date of Service  06/17/2018  HPI/Events of Note  Pt reportedly vomited up approx 2 teaspoons of bright red appearing blood x 1. No recurrence. Denies abdominal pain. Patient has a hx of ETOH abuse.  eICU Interventions  Hold SQ Heparin, check H & H, Protonix 40 mg po Q 12 hrs pending further results, stool occult blood check        [place] U Ogan 06/17/2018, 9:37 PM

## 2018-06-17 NOTE — Progress Notes (Signed)
Pt woke up and began coughing severely. O2 sats decreased to mid 80's. [place] increased to 5L, pt instructed to take deep breaths. Pt stated she did not feel short of breath and still insisting on needing something to drink. Lung auscultation revealing wheezing. HFNC @10L  placed. Elink called for PRN breathing treatment orders.
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
## 2018-06-18 DIAGNOSIS — R4182 Altered mental status, unspecified: Secondary | ICD-10-CM

## 2018-06-18 DIAGNOSIS — T07XXXA Unspecified multiple injuries, initial encounter: Secondary | ICD-10-CM

## 2018-06-18 LAB — GLUCOSE, CAPILLARY
GLUCOSE-CAPILLARY: 92 mg/dL (ref 70–99)
Glucose-Capillary: 139 mg/dL — ABNORMAL HIGH (ref 70–99)
Glucose-Capillary: 96 mg/dL (ref 70–99)
Glucose-Capillary: 94 mg/dL (ref 70–99)
Glucose-Capillary: 100 mg/dL — ABNORMAL HIGH (ref 70–99)

## 2018-06-18 LAB — CULTURE, RESPIRATORY W GRAM STAIN: Culture: NORMAL

## 2018-06-18 LAB — CULTURE, RESPIRATORY

## 2018-06-18 LAB — PROCALCITONIN

## 2018-06-18 MED ORDER — IPRATROPIUM-ALBUTEROL 0.5-2.5 (3) MG/3ML IN SOLN: 3.0000 mL | Freq: Three times a day (TID) | RESPIRATORY_TRACT | Status: DC

## 2018-06-18 MED ORDER — IPRATROPIUM-ALBUTEROL 0.5-2.5 (3) MG/3ML IN SOLN
3.0000 mL | Freq: Three times a day (TID) | RESPIRATORY_TRACT | Status: DC
  Administered 2018-06-18: 3 mL via RESPIRATORY_TRACT
  Filled 2018-06-18 (×3): qty 3

## 2018-06-18 MED ORDER — GUAIFENESIN ER 600 MG PO TB12
600.0000 mg | ORAL_TABLET | Freq: Two times a day (BID) | ORAL | Status: DC
  Administered 2018-06-18: 600 mg via ORAL
  Filled 2018-06-18: qty 1
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
## 2018-06-18 NOTE — Progress Notes (Signed)
Patient left AMA. She declined discharge information.

## 2018-06-18 NOTE — Progress Notes (Signed)
Pt requested social work consult to discuss her discharge options and possible treatment.  Social work was called and a Child psychotherapistsocial worker is going to get her information.
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
## 2018-06-21 LAB — CULTURE, BLOOD (ROUTINE X 2)
CULTURE: NO GROWTH
Culture: NO GROWTH
SPECIAL REQUESTS: ADEQUATE
SPECIAL REQUESTS: ADEQUATE

## 2018-07-22 ENCOUNTER — Inpatient Hospital Stay (INDEPENDENT_AMBULATORY_CARE_PROVIDER_SITE_OTHER): Payer: Self-pay | Admitting: Physician Assistant

## 2018-08-10 ENCOUNTER — Other Ambulatory Visit: Payer: Self-pay

## 2018-08-10 ENCOUNTER — Emergency Department (HOSPITAL_COMMUNITY)
Admission: EM | Admit: 2018-08-10 | Discharge: 2018-08-10 | Disposition: A | Discharge status: Home / Self Care | Payer: Self-pay | Attending: Emergency Medicine | Admitting: Emergency Medicine

## 2018-08-10 ENCOUNTER — Encounter (HOSPITAL_COMMUNITY): Payer: Self-pay

## 2018-08-10 DIAGNOSIS — Z5321 Procedure and treatment not carried out due to patient leaving prior to being seen by health care provider: Secondary | ICD-10-CM | POA: Insufficient documentation

## 2018-08-10 DIAGNOSIS — R109 Unspecified abdominal pain: Secondary | ICD-10-CM

## 2018-08-10 DIAGNOSIS — R1033 Periumbilical pain: Secondary | ICD-10-CM | POA: Insufficient documentation

## 2018-08-10 LAB — COMPREHENSIVE METABOLIC PANEL
ALBUMIN: 3.2 g/dL — AB (ref 3.5–5.0)
ALK PHOS: 57 U/L (ref 38–126)
ALT: 15 U/L (ref 0–44)
AST: 19 U/L (ref 15–41)
Anion gap: 7 (ref 5–15)
BUN: 6 mg/dL (ref 6–20)
CO2: 23 mmol/L (ref 22–32)
CREATININE: 0.86 mg/dL (ref 0.44–1.00)
Calcium: 8.7 mg/dL — ABNORMAL LOW (ref 8.9–10.3)
Chloride: 109 mmol/L (ref 98–111)
GFR calc Af Amer: >60 mL/min (ref >60)
GFR calc non Af Amer: >60 mL/min (ref >60)
GLUCOSE: 91 mg/dL (ref 70–99)
Potassium: 4.1 mmol/L (ref 3.5–5.1)
SODIUM: 139 mmol/L (ref 135–145)
Total Bilirubin: 0.4 mg/dL (ref 0.3–1.2)
Total Protein: 5.6 g/dL — ABNORMAL LOW (ref 6.5–8.1)

## 2018-08-10 LAB — CBC WITH DIFFERENTIAL/PLATELET
ABS IMMATURE GRANULOCYTES: 0.1 10*3/uL (ref 0.0–0.1)
BASOS ABS: 0.1 10*3/uL (ref 0.0–0.1)
Basophils Relative: 1 %
Eosinophils Absolute: 0.2 10*3/uL (ref 0.0–0.7)
Eosinophils Relative: 2 %
HCT: 39.5 % (ref 36.0–46.0)
Hemoglobin: 12.4 g/dL (ref 12.0–15.0)
IMMATURE GRANULOCYTES: 1 %
LYMPHS PCT: 33 %
Lymphs Abs: 3.3 10*3/uL (ref 0.7–4.0)
MCH: 30.3 pg (ref 26.0–34.0)
MCHC: 31.4 g/dL (ref 30.0–36.0)
MCV: 96.6 fL (ref 78.0–100.0)
Monocytes Absolute: 1 10*3/uL (ref 0.1–1.0)
Monocytes Relative: 10 %
NEUTROS ABS: 5.3 10*3/uL (ref 1.7–7.7)
Neutrophils Relative %: 53 %
Platelets: 353 10*3/uL (ref 150–400)
RBC: 4.09 MIL/uL (ref 3.87–5.11)
RDW: 12.7 % (ref 11.5–15.5)
WBC: 9.9 10*3/uL (ref 4.0–10.5)

## 2018-08-10 LAB — LIPASE, BLOOD: Lipase: 35 U/L (ref 11–51)

## 2018-08-10 NOTE — ED Triage Notes (Signed)
Pt here for periumbilical pain for 3 days.  Reports she was out of work for the last two and requests a work note.  No nausea or vomiting.  A&Ox4

## 2018-08-10 NOTE — ED Notes (Signed)
Pt reports she does not wish to wait any longer to be seen. Pt does not wish to speak with a provider or the charge nurse. Pt understands her rights to leave and any potential health consequences.

## 2018-08-10 NOTE — ED Provider Notes (Cosign Needed)
Patient placed in Quick Look pathway, seen and evaluated   Chief Complaint: Abdominal pain  HPI:   Jill Zamora is a 27 y.o. female who presents to ED for abdominal pain x 2 days associated with nausea and vomiting. Last episode of emesis was around noon. Since then, her symptoms have actually started to improve. She had to call out of work for the last 2 days and employer told her that she would need a doctors note to return to work, prompting her to seek care.   ROS: + abdominal pain, n/v  - diarrhea, fever   Physical Exam:   Gen: No distress  Neuro: Awake and Alert  Skin: Warm    Focused Exam: Mild generalized abdominal tenderness without focality.   Initiation of care has begun. The patient has been counseled on the process, plan, and necessity for staying for the completion/evaluation, and the remainder of the medical screening examination    Regie Bunner, Chase Picket, PA-C 08/10/18 1935

## 2018-09-11 DIAGNOSIS — N7093 Salpingitis and oophoritis, unspecified: Secondary | ICD-10-CM

## 2018-09-11 HISTORY — DX: Salpingitis and oophoritis, unspecified: N70.93

## 2018-09-29 ENCOUNTER — Emergency Department (HOSPITAL_COMMUNITY)
Admission: EM | Admit: 2018-09-29 | Discharge: 2018-09-29 | Disposition: A | Discharge status: Home / Self Care | Payer: Self-pay | Attending: Emergency Medicine | Admitting: Emergency Medicine

## 2018-09-29 ENCOUNTER — Encounter (HOSPITAL_COMMUNITY): Payer: Self-pay | Admitting: Internal Medicine

## 2018-09-29 DIAGNOSIS — R197 Diarrhea, unspecified: Secondary | ICD-10-CM | POA: Insufficient documentation

## 2018-09-29 DIAGNOSIS — F17210 Nicotine dependence, cigarettes, uncomplicated: Secondary | ICD-10-CM | POA: Insufficient documentation

## 2018-09-29 DIAGNOSIS — R112 Nausea with vomiting, unspecified: Secondary | ICD-10-CM | POA: Insufficient documentation

## 2018-09-29 LAB — COMPREHENSIVE METABOLIC PANEL
ALBUMIN: 3.2 g/dL — AB (ref 3.5–5.0)
ALK PHOS: 78 U/L (ref 38–126)
ALT: 18 U/L (ref 0–44)
AST: 29 U/L (ref 15–41)
Anion gap: 11 (ref 5–15)
BILIRUBIN TOTAL: 1.4 mg/dL — AB (ref 0.3–1.2)
BUN: 10 mg/dL (ref 6–20)
CALCIUM: 8.9 mg/dL (ref 8.9–10.3)
CO2: 19 mmol/L — AB (ref 22–32)
Chloride: 108 mmol/L (ref 98–111)
Creatinine, Ser: 0.98 mg/dL (ref 0.44–1.00)
GFR calc Af Amer: >60 mL/min (ref >60)
GFR calc non Af Amer: >60 mL/min (ref >60)
GLUCOSE: 120 mg/dL — AB (ref 70–99)
Potassium: 4.6 mmol/L (ref 3.5–5.1)
SODIUM: 138 mmol/L (ref 135–145)
TOTAL PROTEIN: 6.9 g/dL (ref 6.5–8.1)

## 2018-09-29 LAB — CBC WITH DIFFERENTIAL/PLATELET
ABS IMMATURE GRANULOCYTES: 0.25 10*3/uL — AB (ref 0.00–0.07)
Basophils Absolute: 0.1 10*3/uL (ref 0.0–0.1)
Basophils Relative: 0 %
EOS ABS: 0 10*3/uL (ref 0.0–0.5)
Eosinophils Relative: 0 %
HCT: 42.9 % (ref 36.0–46.0)
HEMOGLOBIN: 13.9 g/dL (ref 12.0–15.0)
IMMATURE GRANULOCYTES: 1 %
LYMPHS ABS: 1.8 10*3/uL (ref 0.7–4.0)
Lymphocytes Relative: 8 %
MCH: 29.8 pg (ref 26.0–34.0)
MCHC: 32.4 g/dL (ref 30.0–36.0)
MCV: 92.1 fL (ref 80.0–100.0)
Monocytes Absolute: 1.4 10*3/uL — ABNORMAL HIGH (ref 0.1–1.0)
Monocytes Relative: 6 %
NEUTROS ABS: 19.3 10*3/uL — AB (ref 1.7–7.7)
NEUTROS PCT: 85 %
NRBC: 0 % (ref 0.0–0.2)
Platelets: 375 10*3/uL (ref 150–400)
RBC: 4.66 MIL/uL (ref 3.87–5.11)
RDW: 13.6 % (ref 11.5–15.5)
WBC: 22.8 10*3/uL — AB (ref 4.0–10.5)

## 2018-09-29 LAB — I-STAT BETA HCG BLOOD, ED (MC, WL, AP ONLY): I-stat hCG, quantitative: 11.1 m[IU]/mL — ABNORMAL HIGH (ref <5)

## 2018-09-29 LAB — LIPASE, BLOOD: Lipase: 30 U/L (ref 11–51)

## 2018-09-29 MED ORDER — PROCHLORPERAZINE EDISYLATE 10 MG/2ML IJ SOLN
10.0000 mg | Freq: Once | INTRAMUSCULAR | Status: AC
  Administered 2018-09-29: 10 mg via INTRAVENOUS
  Filled 2018-09-29: qty 2

## 2018-09-29 MED ORDER — DIPHENHYDRAMINE HCL 50 MG/ML IJ SOLN
25.0000 mg | Freq: Once | INTRAMUSCULAR | Status: AC
  Administered 2018-09-29: 25 mg via INTRAVENOUS
  Filled 2018-09-29: qty 1

## 2018-09-29 MED ORDER — ALUM & MAG HYDROXIDE-SIMETH 200-200-20 MG/5ML PO SUSP
15.0000 mL | Freq: Once | ORAL | Status: AC
  Administered 2018-09-29: 15 mL via ORAL
  Filled 2018-09-29: qty 30

## 2018-09-29 MED ORDER — ONDANSETRON 4 MG PO TBDP: 4.0000 mg | ORAL_TABLET | Freq: Three times a day (TID) | ORAL | 0 refills | Status: DC | PRN

## 2018-09-29 MED ORDER — SODIUM CHLORIDE 0.9 % IV BOLUS
1000.0000 mL | Freq: Once | INTRAVENOUS | Status: AC
  Administered 2018-09-29: 1000 mL via INTRAVENOUS

## 2018-09-29 NOTE — ED Triage Notes (Signed)
Pt here c/o nausea/vomiting/diarrhea x2 days and abdominal pain. Pt reports eating old chicken prior to NVD starting. Abdomen very tender on palpation. Given 4mg  zofran by EMS.

## 2018-09-29 NOTE — Discharge Instructions (Signed)
Please return to the emergency department for inability to eat or drink, fever, pinpoint abdominal pain or sudden worsening pain.  Take the Zofran for nausea, you can take Imodium for diarrhea.

## 2018-09-29 NOTE — ED Notes (Signed)
Pt given sprite to drink per Dr. Adela LankFloyd. Endorses decrease in nausea and states that she feels better after medication administration.

## 2018-09-29 NOTE — ED Notes (Signed)
Patient verbalizes understanding of discharge instructions. Opportunity for questioning and answers were provided. Armband removed by staff, pt discharged from ED.  

## 2018-09-29 NOTE — ED Provider Notes (Signed)
[facility] EMERGENCY DEPARTMENT Provider Note   CSN: [id number] Arrival date & time: 09/29/18  2952     History   Chief Complaint Chief Complaint  Patient presents with  . Nausea  . Emesis    HPI Jill Zamora is a 27 y.o. female.  27 yo F with a chief complaint of nausea vomiting diarrhea.  Going on for the past couple days.  Started after she had a piece of old chicken.  She denies other suspicious food intake.  No one else near her is sick with a similar illness.  She has been subjectively hot and cold but has not checked her temperature.  There are some reported red streaks in her vomit, she denies bilious emesis.  Denies dark or bloody stool.  Describes diffuse burning abdominal pain.  The history is provided by the patient.  Abdominal Pain   This is a new problem. The current episode started 2 days ago. The problem occurs constantly. The problem has not changed since onset.The pain is associated with eating and suspicious food intake. The pain is located in the generalized abdominal region. The quality of the pain is burning. The pain is at a severity of 7/10. The pain is moderate. Associated symptoms include fever (subjective), diarrhea, nausea and vomiting. Pertinent negatives include dysuria, headaches, arthralgias and myalgias. Nothing aggravates the symptoms. Nothing relieves the symptoms.    Past Medical History:  Diagnosis Date  . Anxiety   . Depression     Patient Active Problem List   Diagnosis Date Noted  . Alcohol intoxication (HCC) 06/16/2018  . Alcohol abuse with alcohol-induced mood disorder (HCC) 06/08/2017  . Cocaine abuse (HCC) 06/08/2017    History reviewed. No pertinent surgical history.   OB History   None      Home Medications    Prior to Admission medications   Medication Sig Start Date End Date Taking? Authorizing Provider  gabapentin (NEURONTIN) 100 MG capsule Take 2 capsules (200 mg total) by mouth 2 (two) times  daily. Patient not taking: Reported on 06/17/2018 06/08/17   Charm Rings, NP  ondansetron (ZOFRAN ODT) 4 MG disintegrating tablet Take 1 tablet (4 mg total) by mouth every 8 (eight) hours as needed for nausea or vomiting. 09/29/18   Melene Plan, DO    Family History No family history on file.  Social History Social History   Tobacco Use  . Smoking status: Current Every Day Smoker    Packs/day: 2.00    Years: 5.00    Pack years: 10.00    Types: Cigarettes  . Smokeless tobacco: Never Used  Substance Use Topics  . Alcohol use: Yes    Comment: 5 or 6 drinks per day  . Drug use: Yes    Types: Cocaine, Marijuana    Comment: last cocaine use this morning 02/03/18     Allergies   Patient has no known allergies.   Review of Systems Review of Systems  Constitutional: Positive for chills and fever (subjective).  HENT: Negative for congestion and rhinorrhea.   Eyes: Negative for redness and visual disturbance.  Respiratory: Negative for shortness of breath and wheezing.   Cardiovascular: Negative for chest pain and palpitations.  Gastrointestinal: Positive for abdominal pain, diarrhea, nausea and vomiting.  Genitourinary: Negative for dysuria and urgency.  Musculoskeletal: Negative for arthralgias and myalgias.  Skin: Negative for pallor and wound.  Neurological: Negative for dizziness and headaches.     Physical Exam Updated Vital Signs BP 113/68 (BP  Location: Right Arm)   Pulse 84   Resp 16   Ht 5\' 6"  (1.676 m)   SpO2 96%   BMI 37.12 kg/m   Physical Exam  Constitutional: She is oriented to person, place, and time. She appears well-developed and well-nourished. No distress.  HENT:  Head: Normocephalic and atraumatic.  Eyes: Pupils are equal, round, and reactive to light. EOM are normal.  Neck: Normal range of motion. Neck supple.  Cardiovascular: Normal rate and regular rhythm. Exam reveals no gallop and no friction rub.  No murmur heard. Pulmonary/Chest: Effort  normal. She has no wheezes. She has no rales.  Abdominal: Soft. She exhibits no distension and no mass. There is tenderness. There is no guarding.  Diffuse tenderness, difficult to examine as patient pushes hand away  Musculoskeletal: She exhibits no edema or tenderness.  Neurological: She is alert and oriented to person, place, and time.  Skin: Skin is warm and dry. She is not diaphoretic.  Psychiatric: She has a normal mood and affect. Her behavior is normal.  Nursing note and vitals reviewed.    ED Treatments / Results  Labs (all labs ordered are listed, but only abnormal results are displayed) Labs Reviewed  CBC WITH DIFFERENTIAL/PLATELET - Abnormal; Notable for the following components:      Result Value   WBC 22.8 (*)    Neutro Abs 19.3 (*)    Monocytes Absolute 1.4 (*)    Abs Immature Granulocytes 0.25 (*)    All other components within normal limits  COMPREHENSIVE METABOLIC PANEL - Abnormal; Notable for the following components:   CO2 19 (*)    Glucose, Bld 120 (*)    Albumin 3.2 (*)    Total Bilirubin 1.4 (*)    All other components within normal limits  I-STAT BETA HCG BLOOD, ED ([facility], [facility], [facility] ONLY) - Abnormal; Notable for the following components:   I-stat hCG, quantitative 11.1 (*)    All other components within normal limits  LIPASE, BLOOD    EKG None  Radiology No results found.  Procedures Procedures (including critical care time)  Medications Ordered in ED Medications  sodium chloride 0.9 % bolus 1,000 mL (0 mLs Intravenous Stopped 09/29/18 1003)  prochlorperazine (COMPAZINE) injection 10 mg (10 mg Intravenous Given 09/29/18 0855)  diphenhydrAMINE (BENADRYL) injection 25 mg (25 mg Intravenous Given 09/29/18 0855)  alum & mag hydroxide-simeth (MAALOX/MYLANTA) 200-200-20 MG/5ML suspension 15 mL (15 mLs Oral Given 09/29/18 0854)     Initial Impression / Assessment and Plan / ED Course  I have reviewed the triage vital signs and the nursing  notes.  Pertinent labs & imaging results that were available during my care of the patient were reviewed by me and considered in my medical decision making (see chart for details).    27 yo F with a chief complaint of diffuse abdominal pain.  Going on for the past couple days.  Associated with nausea vomiting and diarrhea.  Most likely this is of viral etiology based on her history, will check LFTs and lipase.  Give a bolus of fluids treat her nausea and reassess.  The patient's white count is significantly elevated, her symptoms are significantly improved post GI cocktail and Compazine.  She is tolerating p.o. without issue.  Repeat abdominal exam without focality.  LFTs with an isolated very mildly elevated total bilirubin.  Lipase is normal.  She has an acidosis which is most likely secondary to ketones with her vomiting.    Will discharge home.  Return for worsening symptoms.  10:58 AM:  I have discussed the diagnosis/risks/treatment options with the patient and believe the pt to be eligible for discharge home to follow-up with PCP. We also discussed returning to the ED immediately if new or worsening sx occur. We discussed the sx which are most concerning (e.g., sudden worsening pain, fever, inability to tolerate by mouth) that necessitate immediate return. Medications administered to the patient during their visit and any new prescriptions provided to the patient are listed below.  Medications given during this visit Medications  sodium chloride 0.9 % bolus 1,000 mL (0 mLs Intravenous Stopped 09/29/18 1003)  prochlorperazine (COMPAZINE) injection 10 mg (10 mg Intravenous Given 09/29/18 0855)  diphenhydrAMINE (BENADRYL) injection 25 mg (25 mg Intravenous Given 09/29/18 0855)  alum & mag hydroxide-simeth (MAALOX/MYLANTA) 200-200-20 MG/5ML suspension 15 mL (15 mLs Oral Given 09/29/18 0854)     The patient appears reasonably screen and/or stabilized for discharge and I doubt any other medical  condition or other [facility] requiring further screening, evaluation, or treatment in the ED at this time prior to discharge.    Final Clinical Impressions(s) / ED Diagnoses   Final diagnoses:  Nausea vomiting and diarrhea    ED Discharge Orders         Ordered    ondansetron (ZOFRAN ODT) 4 MG disintegrating tablet  Every 8 hours PRN     09/29/18 1056           Melene PlanFloyd, Nichola Cieslinski, DO 09/29/18 1058

## 2018-09-30 ENCOUNTER — Emergency Department (HOSPITAL_COMMUNITY)
Admission: EM | Admit: 2018-09-30 | Discharge: 2018-10-01 | Disposition: A | Discharge status: Home / Self Care | Payer: Self-pay | Attending: Emergency Medicine | Admitting: Emergency Medicine

## 2018-09-30 ENCOUNTER — Encounter (HOSPITAL_COMMUNITY): Payer: Self-pay

## 2018-09-30 ENCOUNTER — Other Ambulatory Visit: Payer: Self-pay

## 2018-09-30 DIAGNOSIS — F17210 Nicotine dependence, cigarettes, uncomplicated: Secondary | ICD-10-CM | POA: Insufficient documentation

## 2018-09-30 DIAGNOSIS — R197 Diarrhea, unspecified: Secondary | ICD-10-CM | POA: Insufficient documentation

## 2018-09-30 DIAGNOSIS — R112 Nausea with vomiting, unspecified: Secondary | ICD-10-CM | POA: Insufficient documentation

## 2018-09-30 NOTE — ED Triage Notes (Signed)
Pt reports chest pain, abd pain, and vomiting blood. Pt reports being seen [facility]. Pt was sent home with Zofran but was unable to fill prescription due to it being too expensive. Pt actively vomiting in triage at this time.

## 2018-10-01 LAB — BASIC METABOLIC PANEL
ANION GAP: 10 (ref 5–15)
BUN: 10 mg/dL (ref 6–20)
CO2: 26 mmol/L (ref 22–32)
Calcium: 8 mg/dL — ABNORMAL LOW (ref 8.9–10.3)
Chloride: 106 mmol/L (ref 98–111)
Creatinine, Ser: 0.7 mg/dL (ref 0.44–1.00)
Glucose, Bld: 121 mg/dL — ABNORMAL HIGH (ref 70–99)
Potassium: 3.4 mmol/L — ABNORMAL LOW (ref 3.5–5.1)
SODIUM: 142 mmol/L (ref 135–145)

## 2018-10-01 LAB — CBC WITH DIFFERENTIAL/PLATELET
Abs Immature Granulocytes: 0.24 10*3/uL — ABNORMAL HIGH (ref 0.00–0.07)
BASOS ABS: 0.1 10*3/uL (ref 0.0–0.1)
BASOS PCT: 0 %
EOS ABS: 0 10*3/uL (ref 0.0–0.5)
EOS PCT: 0 %
HEMATOCRIT: 38.9 % (ref 36.0–46.0)
Hemoglobin: 12.3 g/dL (ref 12.0–15.0)
IMMATURE GRANULOCYTES: 1 %
Lymphocytes Relative: 7 %
Lymphs Abs: 1.4 10*3/uL (ref 0.7–4.0)
MCH: 29.8 pg (ref 26.0–34.0)
MCHC: 31.6 g/dL (ref 30.0–36.0)
MCV: 94.2 fL (ref 80.0–100.0)
Monocytes Absolute: 1.2 10*3/uL — ABNORMAL HIGH (ref 0.1–1.0)
Monocytes Relative: 7 %
NEUTROS PCT: 85 %
NRBC: 0 % (ref 0.0–0.2)
Neutro Abs: 15.3 10*3/uL — ABNORMAL HIGH (ref 1.7–7.7)
PLATELETS: 389 10*3/uL (ref 150–400)
RBC: 4.13 MIL/uL (ref 3.87–5.11)
RDW: 13.6 % (ref 11.5–15.5)
WBC: 18.1 10*3/uL — AB (ref 4.0–10.5)

## 2018-10-01 LAB — HCG, QUANTITATIVE, PREGNANCY

## 2018-10-01 MED ORDER — SODIUM CHLORIDE 0.9 % IV BOLUS
1000.0000 mL | Freq: Once | INTRAVENOUS | Status: AC
  Administered 2018-10-01: 1000 mL via INTRAVENOUS

## 2018-10-01 MED ORDER — FAMOTIDINE IN NACL 20-0.9 MG/50ML-% IV SOLN
20.0000 mg | Freq: Once | INTRAVENOUS | Status: AC
  Administered 2018-10-01: 20 mg via INTRAVENOUS
  Filled 2018-10-01: qty 50

## 2018-10-01 MED ORDER — ONDANSETRON HCL 4 MG/2ML IJ SOLN
4.0000 mg | Freq: Once | INTRAMUSCULAR | Status: AC
  Administered 2018-10-01: 4 mg via INTRAVENOUS
  Filled 2018-10-01: qty 2

## 2018-10-01 MED ORDER — PANTOPRAZOLE SODIUM 40 MG PO TBEC
40.0000 mg | DELAYED_RELEASE_TABLET | Freq: Once | ORAL | Status: AC
  Administered 2018-10-01: 40 mg via ORAL
  Filled 2018-10-01: qty 1

## 2018-10-01 MED ORDER — PROMETHAZINE HCL 25 MG PO TABS: 25.0000 mg | ORAL_TABLET | Freq: Four times a day (QID) | ORAL | 0 refills | Status: DC | PRN

## 2018-10-01 MED ORDER — PROMETHAZINE HCL 25 MG/ML IJ SOLN
25.0000 mg | Freq: Once | INTRAMUSCULAR | Status: AC
  Administered 2018-10-01: 25 mg via INTRAVENOUS
  Filled 2018-10-01: qty 1

## 2018-10-01 NOTE — ED Notes (Signed)
[facility]

## 2018-10-01 NOTE — ED Provider Notes (Signed)
[facility]-EMERGENCY DEPT Provider Note: Lowella DellJ. Lane Eladio Dentremont, MD, FACEP  CSN: [id number] MRN: [id number] ARRIVAL: 09/30/18 at 2247 ROOM: WA08/WA08   CHIEF COMPLAINT  Vomiting   HISTORY OF PRESENT ILLNESS  10/01/18 12:08 AM Jill Zamora is a 27 y.o. female with a history of alcohol and cocaine abuse.  [facility] for several days of nausea, vomiting and diarrhea.  She attributed this to eating a piece of "old chicken".  She was complaining of diffuse burning abdominal pain.  She was discharged with a prescription for Zofran but was unable to fill it due to expense.  She received turns due to persistent vomiting along with burning lower abdominal pain and chest pain which she describes as an 8 out of 10.  She describes the emesis as blood-streaked.  Her diarrhea has subsided and she passed a large, formed stool yesterday.  Her abdominal pain is worse with movement or palpation.  As noted above she was unable to get her Zofran filled since she has taken nothing for her symptoms.   Past Medical History:  Diagnosis Date  . Anxiety   . Depression     History reviewed. No pertinent surgical history.  History reviewed. No pertinent family history.  Social History   Tobacco Use  . Smoking status: Current Every Day Smoker    Packs/day: 2.00    Years: 5.00    Pack years: 10.00    Types: Cigarettes  . Smokeless tobacco: Never Used  Substance Use Topics  . Alcohol use: Yes    Comment: 5 or 6 drinks per day  . Drug use: Yes    Types: Cocaine, Marijuana    Comment: last cocaine use this morning 02/03/18    Prior to Admission medications   Medication Sig Start Date End Date Taking? Authorizing Provider  gabapentin (NEURONTIN) 100 MG capsule Take 2 capsules (200 mg total) by mouth 2 (two) times daily. Patient not taking: Reported on 06/17/2018 06/08/17   Charm RingsLord, Jamison Y, NP  ondansetron (ZOFRAN ODT) 4 MG disintegrating tablet Take 1 tablet (4 mg total) by mouth every 8  (eight) hours as needed for nausea or vomiting. 09/29/18   Melene PlanFloyd, Dan, DO    Allergies Patient has no known allergies.   REVIEW OF SYSTEMS  Negative except as noted here or in the History of Present Illness.   PHYSICAL EXAMINATION  Initial Vital Signs Blood pressure (!) 159/88, pulse 94, temperature 97.9 F (36.6 C), temperature source Oral, resp. rate (!) 28, height 5\' 6"  (1.676 m), SpO2 95 %.  Examination General: Well-developed, well-nourished female in no acute distress; appearance consistent with age of record HENT: normocephalic; atraumatic Eyes: pupils equal, round and reactive to light; extraocular muscles intact Neck: supple Heart: regular rate and rhythm Lungs: clear to auscultation bilaterally Abdomen: soft; nondistended; lower abdominal tenderness; no masses or hepatosplenomegaly; bowel sounds present Extremities: No deformity; full range of motion; pulses normal Neurologic: Awake, alert and oriented; motor function intact in all extremities and symmetric; no facial droop Skin: Warm and dry Psychiatric: Flat affect   RESULTS  Summary of this visit's results, reviewed by myself:   EKG Interpretation  Date/Time:  Wednesday September 30 2018 23:03:29 EST Ventricular Rate:  101 PR Interval:    QRS Duration: 93 QT Interval:  360 QTC Calculation: 467 R Axis:   72 Text Interpretation:  Sinus tachycardia Low voltage, precordial leads Baseline wander in lead(s) II III aVF V1 V2 V3 V4 V5 Confirmed by Keaja Reaume,  Jonny Ruiz ([id number]) on 09/30/2018 11:52:55 PM      Laboratory Studies: Results for orders placed or performed during the hospital encounter of 09/30/18 (from the past 24 hour(s))  Basic metabolic panel     Status: Abnormal   Collection Time: 10/01/18 12:17 AM  Result Value Ref Range   Sodium 142 135 - 145 mmol/L   Potassium 3.4 (L) 3.5 - 5.1 mmol/L   Chloride 106 98 - 111 mmol/L   CO2 26 22 - 32 mmol/L   Glucose, Bld 121 (H) 70 - 99 mg/dL   BUN 10 6 - 20 mg/dL    Creatinine, Ser 6.04 0.44 - 1.00 mg/dL   Calcium 8.0 (L) 8.9 - 10.3 mg/dL   GFR calc non Af Amer >60 >60 mL/min   GFR calc Af Amer >60 >60 mL/min   Anion gap 10 5 - 15  CBC with Differential/Platelet     Status: Abnormal   Collection Time: 10/01/18 12:17 AM  Result Value Ref Range   WBC 18.1 (H) 4.0 - 10.5 K/uL   RBC 4.13 3.87 - 5.11 MIL/uL   Hemoglobin 12.3 12.0 - 15.0 g/dL   HCT 54.0 98.1 - 19.1 %   MCV 94.2 80.0 - 100.0 fL   MCH 29.8 26.0 - 34.0 pg   MCHC 31.6 30.0 - 36.0 g/dL   RDW 47.8 29.5 - 62.1 %   Platelets 389 150 - 400 K/uL   nRBC 0.0 0.0 - 0.2 %   Neutrophils Relative % 85 %   Neutro Abs 15.3 (H) 1.7 - 7.7 K/uL   Lymphocytes Relative 7 %   Lymphs Abs 1.4 0.7 - 4.0 K/uL   Monocytes Relative 7 %   Monocytes Absolute 1.2 (H) 0.1 - 1.0 K/uL   Eosinophils Relative 0 %   Eosinophils Absolute 0.0 0.0 - 0.5 K/uL   Basophils Relative 0 %   Basophils Absolute 0.1 0.0 - 0.1 K/uL   Immature Granulocytes 1 %   Abs Immature Granulocytes 0.24 (H) 0.00 - 0.07 K/uL  hCG, quantitative, pregnancy     Status: None   Collection Time: 10/01/18 12:18 AM  Result Value Ref Range   hCG, Beta Chain, Quant, S <1 <5 mIU/mL   Imaging Studies: No results found.  ED COURSE and MDM  Nursing notes and initial vitals signs, including pulse oximetry, reviewed.  Vitals:   09/30/18 2307 10/01/18 0241 10/01/18 0306  BP: (!) 159/88 121/72 121/72  Pulse: 94 90 78  Resp: (!) 28  18  Temp: 97.9 F (36.6 C)    TempSrc: Oral    SpO2: 95% 99% 95%  Height: 5\' 6"  (1.676 m)     2:08 AM Nausea and pain significantly improved.  She is still having some substernal burning which she attributes to vomiting.  We will give her Pepcid and Protonix as this is likely due to acid.   5:07 AM Patient able to hold down water without emesis.  We will prescribe Phenergan tablets as she is unable to afford Zofran or Phenergan suppositories.  PROCEDURES    ED DIAGNOSES     ICD-10-CM   1. Nausea and vomiting  in adult R11.2        Shaneika Rossa, Jonny Ruiz, MD 10/01/18 [phone]

## 2018-10-03 ENCOUNTER — Emergency Department (HOSPITAL_COMMUNITY): Payer: Self-pay

## 2018-10-03 ENCOUNTER — Inpatient Hospital Stay (HOSPITAL_COMMUNITY)
Admission: EM | Admit: 2018-10-03 | Discharge: 2018-10-06 | DRG: 758 | Disposition: A | Discharge status: Home / Self Care | Payer: Self-pay | Attending: Obstetrics and Gynecology | Admitting: Obstetrics and Gynecology

## 2018-10-03 ENCOUNTER — Other Ambulatory Visit: Payer: Self-pay | Admitting: Obstetrics and Gynecology

## 2018-10-03 ENCOUNTER — Encounter (HOSPITAL_COMMUNITY): Payer: Self-pay | Admitting: Emergency Medicine

## 2018-10-03 ENCOUNTER — Other Ambulatory Visit: Payer: Self-pay

## 2018-10-03 DIAGNOSIS — N926 Irregular menstruation, unspecified: Secondary | ICD-10-CM | POA: Diagnosis present

## 2018-10-03 DIAGNOSIS — E876 Hypokalemia: Secondary | ICD-10-CM | POA: Diagnosis present

## 2018-10-03 DIAGNOSIS — K920 Hematemesis: Secondary | ICD-10-CM | POA: Diagnosis present

## 2018-10-03 DIAGNOSIS — N7093 Salpingitis and oophoritis, unspecified: Principal | ICD-10-CM | POA: Diagnosis present

## 2018-10-03 DIAGNOSIS — F17210 Nicotine dependence, cigarettes, uncomplicated: Secondary | ICD-10-CM | POA: Diagnosis present

## 2018-10-03 DIAGNOSIS — F1210 Cannabis abuse, uncomplicated: Secondary | ICD-10-CM | POA: Diagnosis present

## 2018-10-03 HISTORY — DX: Cocaine abuse, uncomplicated: F14.10

## 2018-10-03 HISTORY — DX: Alcohol abuse with alcohol-induced mood disorder: F10.14

## 2018-10-03 LAB — COMPREHENSIVE METABOLIC PANEL
ALT: 11 U/L (ref 0–44)
ANION GAP: 10 (ref 5–15)
AST: 36 U/L (ref 15–41)
Albumin: 2.7 g/dL — ABNORMAL LOW (ref 3.5–5.0)
Alkaline Phosphatase: 62 U/L (ref 38–126)
BUN: 8 mg/dL (ref 6–20)
CHLORIDE: 106 mmol/L (ref 98–111)
CO2: 22 mmol/L (ref 22–32)
CREATININE: 0.78 mg/dL (ref 0.44–1.00)
Calcium: 8.2 mg/dL — ABNORMAL LOW (ref 8.9–10.3)
Glucose, Bld: 126 mg/dL — ABNORMAL HIGH (ref 70–99)
POTASSIUM: 4.6 mmol/L (ref 3.5–5.1)
SODIUM: 138 mmol/L (ref 135–145)
Total Bilirubin: 2 mg/dL — ABNORMAL HIGH (ref 0.3–1.2)
Total Protein: 6.1 g/dL — ABNORMAL LOW (ref 6.5–8.1)

## 2018-10-03 LAB — URINALYSIS, ROUTINE W REFLEX MICROSCOPIC
Bacteria, UA: NONE SEEN
Bilirubin Urine: NEGATIVE
Glucose, UA: NEGATIVE mg/dL
Ketones, ur: 5 mg/dL — AB
Leukocytes, UA: NEGATIVE
Nitrite: NEGATIVE
Protein, ur: NEGATIVE mg/dL
Specific Gravity, Urine: >1.046 — ABNORMAL HIGH (ref 1.005–1.030)
pH: 7 (ref 5.0–8.0)

## 2018-10-03 LAB — RAPID URINE DRUG SCREEN, HOSP PERFORMED
AMPHETAMINES: NOT DETECTED
BENZODIAZEPINES: NOT DETECTED
Barbiturates: NOT DETECTED
COCAINE: NOT DETECTED
OPIATES: POSITIVE — AB
TETRAHYDROCANNABINOL: POSITIVE — AB

## 2018-10-03 LAB — I-STAT BETA HCG BLOOD, ED (MC, WL, AP ONLY): I-stat hCG, quantitative: <5 m[IU]/mL (ref <5)

## 2018-10-03 LAB — WET PREP, GENITAL
Clue Cells Wet Prep HPF POC: NONE SEEN
Sperm: NONE SEEN
Trich, Wet Prep: NONE SEEN
Yeast Wet Prep HPF POC: NONE SEEN

## 2018-10-03 LAB — CBC WITH DIFFERENTIAL/PLATELET
Abs Immature Granulocytes: 0.27 10*3/uL — ABNORMAL HIGH (ref 0.00–0.07)
Basophils Absolute: 0.1 10*3/uL (ref 0.0–0.1)
Basophils Relative: 0 %
Eosinophils Absolute: 0 10*3/uL (ref 0.0–0.5)
Eosinophils Relative: 0 %
HCT: 41.6 % (ref 36.0–46.0)
Hemoglobin: 13 g/dL (ref 12.0–15.0)
Immature Granulocytes: 1 %
Lymphocytes Relative: 18 %
Lymphs Abs: 3.5 10*3/uL (ref 0.7–4.0)
MCH: 28.9 pg (ref 26.0–34.0)
MCHC: 31.3 g/dL (ref 30.0–36.0)
MCV: 92.4 fL (ref 80.0–100.0)
Monocytes Absolute: 1.3 10*3/uL — ABNORMAL HIGH (ref 0.1–1.0)
Monocytes Relative: 7 %
Neutro Abs: 14.3 10*3/uL — ABNORMAL HIGH (ref 1.7–7.7)
Neutrophils Relative %: 74 %
Platelets: 437 10*3/uL — ABNORMAL HIGH (ref 150–400)
RBC: 4.5 MIL/uL (ref 3.87–5.11)
RDW: 13.5 % (ref 11.5–15.5)
WBC: 19.5 10*3/uL — ABNORMAL HIGH (ref 4.0–10.5)
nRBC: 0 % (ref 0.0–0.2)

## 2018-10-03 LAB — TYPE AND SCREEN
ABO/RH(D): O POS
Antibody Screen: NEGATIVE

## 2018-10-03 LAB — POTASSIUM: Potassium: 3.2 mmol/L — ABNORMAL LOW (ref 3.5–5.1)

## 2018-10-03 LAB — LIPASE, BLOOD: LIPASE: 27 U/L (ref 11–51)

## 2018-10-03 LAB — ABO/RH: ABO/RH(D): O POS

## 2018-10-03 MED ORDER — ONDANSETRON 4 MG PO TBDP
4.0000 mg | ORAL_TABLET | Freq: Three times a day (TID) | ORAL | Status: DC | PRN
  Filled 2018-10-03 (×2): qty 1

## 2018-10-03 MED ORDER — ONDANSETRON HCL 4 MG/2ML IJ SOLN
4.0000 mg | Freq: Three times a day (TID) | INTRAMUSCULAR | Status: DC | PRN
  Administered 2018-10-04 – 2018-10-05 (×5): 4 mg via INTRAVENOUS
  Filled 2018-10-03 (×5): qty 2

## 2018-10-03 MED ORDER — VITAMIN B-1 100 MG PO TABS
100.0000 mg | ORAL_TABLET | Freq: Every day | ORAL | Status: DC
  Administered 2018-10-04: 100 mg via ORAL
  Filled 2018-10-03 (×2): qty 1

## 2018-10-03 MED ORDER — ONDANSETRON HCL 4 MG/2ML IJ SOLN
4.0000 mg | Freq: Once | INTRAMUSCULAR | Status: AC
  Administered 2018-10-03: 4 mg via INTRAVENOUS
  Filled 2018-10-03: qty 2

## 2018-10-03 MED ORDER — THIAMINE HCL 100 MG/ML IJ SOLN
100.0000 mg | Freq: Every day | INTRAMUSCULAR | Status: DC
  Filled 2018-10-03 (×2): qty 1

## 2018-10-03 MED ORDER — SODIUM CHLORIDE 0.9 % IV SOLN
100.0000 mg | Freq: Once | INTRAVENOUS | Status: AC
  Administered 2018-10-03: 100 mg via INTRAVENOUS
  Filled 2018-10-03: qty 100

## 2018-10-03 MED ORDER — ACETAMINOPHEN 325 MG PO TABS
650.0000 mg | ORAL_TABLET | Freq: Four times a day (QID) | ORAL | Status: DC | PRN
  Administered 2018-10-05: 650 mg via ORAL
  Filled 2018-10-03: qty 2

## 2018-10-03 MED ORDER — LIDOCAINE VISCOUS HCL 2 % MT SOLN: 15.0000 mL | Freq: Once | OROMUCOSAL | Status: DC

## 2018-10-03 MED ORDER — SODIUM CHLORIDE 0.9 % IV SOLN
2.0000 g | Freq: Four times a day (QID) | INTRAVENOUS | Status: DC
  Administered 2018-10-03 – 2018-10-06 (×11): 2 g via INTRAVENOUS
  Filled 2018-10-03 (×12): qty 2

## 2018-10-03 MED ORDER — PRENATAL MULTIVITAMIN CH
1.0000 | ORAL_TABLET | Freq: Every day | ORAL | Status: DC
  Administered 2018-10-04: 1 via ORAL
  Filled 2018-10-03: qty 1

## 2018-10-03 MED ORDER — SIMETHICONE 80 MG PO CHEW: 80.0000 mg | CHEWABLE_TABLET | Freq: Four times a day (QID) | ORAL | Status: DC | PRN

## 2018-10-03 MED ORDER — DOXYCYCLINE HYCLATE 100 MG PO TABS
100.0000 mg | ORAL_TABLET | Freq: Two times a day (BID) | ORAL | Status: DC
  Administered 2018-10-04 – 2018-10-06 (×5): 100 mg via ORAL
  Filled 2018-10-03 (×5): qty 1

## 2018-10-03 MED ORDER — POLYETHYLENE GLYCOL 3350 17 G PO PACK
17.0000 g | PACK | Freq: Every day | ORAL | Status: DC
  Administered 2018-10-04: 17 g via ORAL
  Filled 2018-10-03: qty 1

## 2018-10-03 MED ORDER — FAMOTIDINE 20 MG PO TABS
20.0000 mg | ORAL_TABLET | Freq: Two times a day (BID) | ORAL | Status: DC
  Administered 2018-10-03 – 2018-10-06 (×6): 20 mg via ORAL
  Filled 2018-10-03 (×7): qty 1

## 2018-10-03 MED ORDER — METRONIDAZOLE 500 MG PO TABS
500.0000 mg | ORAL_TABLET | Freq: Two times a day (BID) | ORAL | Status: DC
  Administered 2018-10-03 – 2018-10-06 (×6): 500 mg via ORAL
  Filled 2018-10-03 (×6): qty 1

## 2018-10-03 MED ORDER — IOHEXOL 300 MG/ML  SOLN
100.0000 mL | Freq: Once | INTRAMUSCULAR | Status: AC | PRN
  Administered 2018-10-03: 100 mL via INTRAVENOUS

## 2018-10-03 MED ORDER — DOXYCYCLINE HYCLATE 100 MG PO TABS
100.0000 mg | ORAL_TABLET | Freq: Two times a day (BID) | ORAL | Status: DC
  Filled 2018-10-03 (×2): qty 1

## 2018-10-03 MED ORDER — ALUM & MAG HYDROXIDE-SIMETH 200-200-20 MG/5ML PO SUSP
30.0000 mL | Freq: Once | ORAL | Status: AC
  Administered 2018-10-03: 30 mL via ORAL
  Filled 2018-10-03: qty 30

## 2018-10-03 MED ORDER — SODIUM CHLORIDE 0.9 % IV BOLUS
1000.0000 mL | Freq: Once | INTRAVENOUS | Status: AC
  Administered 2018-10-03: 1000 mL via INTRAVENOUS

## 2018-10-03 MED ORDER — IBUPROFEN 600 MG PO TABS: 600.0000 mg | ORAL_TABLET | Freq: Four times a day (QID) | ORAL | Status: DC | PRN

## 2018-10-03 MED ORDER — SENNA 8.6 MG PO TABS
1.0000 | ORAL_TABLET | Freq: Every evening | ORAL | Status: DC | PRN
  Filled 2018-10-03: qty 1

## 2018-10-03 MED ORDER — METRONIDAZOLE 500 MG PO TABS: 500.0000 mg | ORAL_TABLET | Freq: Two times a day (BID) | ORAL | Status: DC

## 2018-10-03 MED ORDER — SODIUM CHLORIDE 0.9 % IV SOLN
2.0000 g | Freq: Once | INTRAVENOUS | Status: AC
  Administered 2018-10-03: 2 g via INTRAVENOUS
  Filled 2018-10-03: qty 2

## 2018-10-03 MED ORDER — FOLIC ACID 1 MG PO TABS
1.0000 mg | ORAL_TABLET | Freq: Every day | ORAL | Status: DC
  Administered 2018-10-04: 1 mg via ORAL
  Filled 2018-10-03 (×4): qty 1

## 2018-10-03 MED ORDER — OXYCODONE HCL 5 MG PO TABS
5.0000 mg | ORAL_TABLET | ORAL | Status: DC | PRN
  Administered 2018-10-05 – 2018-10-06 (×2): 5 mg via ORAL
  Filled 2018-10-03 (×2): qty 1

## 2018-10-03 MED ORDER — PROCHLORPERAZINE EDISYLATE 10 MG/2ML IJ SOLN
10.0000 mg | Freq: Four times a day (QID) | INTRAMUSCULAR | Status: DC | PRN
  Administered 2018-10-04 – 2018-10-05 (×3): 10 mg via INTRAVENOUS
  Filled 2018-10-03 (×5): qty 2

## 2018-10-03 MED ORDER — LORAZEPAM 2 MG/ML IJ SOLN: 1.0000 mg | Freq: Four times a day (QID) | INTRAMUSCULAR | Status: DC | PRN

## 2018-10-03 MED ORDER — LORAZEPAM 1 MG PO TABS: 0.0000 mg | ORAL_TABLET | Freq: Two times a day (BID) | ORAL | Status: DC

## 2018-10-03 MED ORDER — LORAZEPAM 1 MG PO TABS: 0.0000 mg | ORAL_TABLET | Freq: Four times a day (QID) | ORAL | Status: DC

## 2018-10-03 MED ORDER — PROCHLORPERAZINE MALEATE 10 MG PO TABS
10.0000 mg | ORAL_TABLET | Freq: Four times a day (QID) | ORAL | Status: DC | PRN
  Administered 2018-10-03: 10 mg via ORAL
  Filled 2018-10-03 (×3): qty 1

## 2018-10-03 MED ORDER — SODIUM CHLORIDE 0.9 % IV SOLN
80.0000 mg | Freq: Once | INTRAVENOUS | Status: AC
  Administered 2018-10-03: 80 mg via INTRAVENOUS
  Filled 2018-10-03: qty 80

## 2018-10-03 MED ORDER — LACTATED RINGERS IV SOLN
INTRAVENOUS | Status: AC
  Administered 2018-10-03 – 2018-10-04 (×3): via INTRAVENOUS

## 2018-10-03 MED ORDER — MORPHINE SULFATE (PF) 4 MG/ML IV SOLN
4.0000 mg | Freq: Once | INTRAVENOUS | Status: AC
  Administered 2018-10-03: 4 mg via INTRAVENOUS
  Filled 2018-10-03: qty 1

## 2018-10-03 MED ORDER — ADULT MULTIVITAMIN W/MINERALS CH
1.0000 | ORAL_TABLET | Freq: Every day | ORAL | Status: DC
  Filled 2018-10-03 (×3): qty 1

## 2018-10-03 MED ORDER — LORAZEPAM 1 MG PO TABS: 1.0000 mg | ORAL_TABLET | Freq: Four times a day (QID) | ORAL | Status: DC | PRN

## 2018-10-03 NOTE — ED Notes (Signed)
Attempted to collect labs. Pt in ultrasound

## 2018-10-03 NOTE — Plan of Care (Signed)
  Problem: Education: Goal: Knowledge of General Education information will improve Description Including pain rating scale, medication(s)/side effects and non-pharmacologic comfort measures Outcome: Completed/Met

## 2018-10-03 NOTE — ED Provider Notes (Signed)
[facility] EMERGENCY DEPARTMENT Provider Note   CSN: [id number] Arrival date & time: 10/03/18  1052     History   Chief Complaint Chief Complaint  Patient presents with  . Hematemesis  . Abdominal Pain    HPI Jill Zamora is a 27 y.o. female with history of alcohol abuse, cocaine abuse, anxiety, depression presents for evaluation of ongoing and worsening abdominal pain and hematemesis for 4 days.  She notes constant burning upper abdominal pain which radiates bilaterally and up to the chest.  She notes multiple episodes of bloody emesis and ongoing nausea.  Denies fevers or chills.  Reports some watery nonbloody diarrhea.  Symptoms began after eating a piece of old chicken.  No recent travel or treatment with antibiotics.  Has tried taking Phenergan but states this made her symptoms worse.  Has been seen in the ED twice in the past 4 days for the same.    Of note she states that she has had progressively worsening pressure-like pain to the left lower quadrant of the abdomen radiating into the pelvis since 1 week prior to symptom onset.  She denies abnormal vaginal discharge or itching.  She states that she started her period today and it is heavier and more painful than usual and has a history of irregular menses.  She reports she is sexually active with one female partner but does not always use contraception.   The history is provided by the patient.    Past Medical History:  Diagnosis Date  . Anxiety   . Depression     Patient Active Problem List   Diagnosis Date Noted  . Alcohol intoxication (HCC) 06/16/2018  . Alcohol abuse with alcohol-induced mood disorder (HCC) 06/08/2017  . Cocaine abuse (HCC) 06/08/2017    History reviewed. No pertinent surgical history.   OB History   None      Home Medications    Prior to Admission medications   Medication Sig Start Date End Date Taking? Authorizing Provider  promethazine (PHENERGAN) 25 MG tablet Take 1  tablet (25 mg total) by mouth every 6 (six) hours as needed for nausea or vomiting. 10/01/18   Molpus, John, MD    Family History No family history on file.  Social History Social History   Tobacco Use  . Smoking status: Current Every Day Smoker    Packs/day: 2.00    Years: 5.00    Pack years: 10.00    Types: Cigarettes  . Smokeless tobacco: Never Used  Substance Use Topics  . Alcohol use: Yes    Comment: 5 or 6 drinks per day  . Drug use: Yes    Types: Cocaine, Marijuana    Comment: last cocaine use this morning 02/03/18     Allergies   Patient has no known allergies.   Review of Systems Review of Systems  Constitutional: Negative for chills and fever.  Cardiovascular: Positive for chest pain.  Gastrointestinal: Positive for abdominal pain, diarrhea, nausea and vomiting.       +hematemesis  Genitourinary: Negative for decreased urine volume, vaginal bleeding, vaginal discharge and vaginal pain.  All other systems reviewed and are negative.    Physical Exam Updated Vital Signs BP 129/73 (BP Location: Right Arm)   Pulse 64   Temp 98 F (36.7 C) (Oral)   Resp 17   Wt 113.4 kg   LMP 08/03/2018   SpO2 99%   BMI 40.35 kg/m   Physical Exam  Constitutional: She appears well-developed and well-nourished.  She appears ill. No distress.  Appears uncomfortable, actively vomiting  HENT:  Head: Normocephalic and atraumatic.  Eyes: Conjunctivae are normal. Right eye exhibits no discharge. Left eye exhibits no discharge.  Neck: No JVD present. No tracheal deviation present.  Cardiovascular: Normal rate, regular rhythm, normal heart sounds and intact distal pulses.  Pulmonary/Chest: Effort normal and breath sounds normal.  Abdominal: Soft. Bowel sounds are normal. She exhibits no distension. There is generalized tenderness and tenderness in the epigastric area. There is guarding and positive Murphy's sign. There is no rigidity and no rebound.  Maximally tender in the  epigastric region  Genitourinary: Cervix exhibits motion tenderness. Right adnexum displays tenderness. Left adnexum displays tenderness and fullness. There is bleeding in the vagina.  Genitourinary Comments: Examination performed in the presence of a chaperone.  No masses or lesions to the external genitalia.  She has cervical motion tenderness and bilateral adnexal tenderness, left worse than right.  Musculoskeletal: She exhibits no edema.  No midline spine TTP, no paraspinal muscle tenderness, no deformity, crepitus, or step-off noted   Neurological: She is alert.  Skin: Skin is warm and dry. No erythema.  Psychiatric: She has a normal mood and affect. Her behavior is normal.  Nursing note and vitals reviewed.    ED Treatments / Results  Labs (all labs ordered are listed, but only abnormal results are displayed) Labs Reviewed  CBC WITH DIFFERENTIAL/PLATELET - Abnormal; Notable for the following components:      Result Value   WBC 19.5 (*)    Platelets 437 (*)    Neutro Abs 14.3 (*)    Monocytes Absolute 1.3 (*)    Abs Immature Granulocytes 0.27 (*)    All other components within normal limits  COMPREHENSIVE METABOLIC PANEL - Abnormal; Notable for the following components:   Glucose, Bld 126 (*)    Calcium 8.2 (*)    Total Protein 6.1 (*)    Albumin 2.7 (*)    Total Bilirubin 2.0 (*)    All other components within normal limits  POTASSIUM - Abnormal; Notable for the following components:   Potassium 3.2 (*)    All other components within normal limits  WET PREP, GENITAL  LIPASE, BLOOD  URINALYSIS, ROUTINE W REFLEX MICROSCOPIC  RPR  HIV ANTIBODY (ROUTINE TESTING W REFLEX)  I-STAT BETA HCG BLOOD, ED ([facility], [facility], [facility] ONLY)  TYPE AND SCREEN  ABO/RH  GC/CHLAMYDIA PROBE AMP ([place]) NOT AT [facility]    EKG None  Radiology US Transvaginal Non-ob  Result Date: 10/03/2018 CLINICAL DATA:  27 year old presenting with three-day history of generalized abdominal pain and  hematemesis. CT earlier same day questioned a possible tubo-ovarian abscess. EXAM: TRANSABDOMINAL AND TRANSVAGINAL ULTRASOUND OF PELVIS DOPPLER ULTRASOUND OF OVARIES TECHNIQUE: Both transabdominal and transvaginal ultrasound examinations of the pelvis were performed. Transabdominal technique was performed for global imaging of the pelvis including uterus, ovaries, adnexal regions, and pelvic cul-de-sac. It was necessary to proceed with endovaginal exam following the transabdominal exam to visualize the ovaries and endometrium due to the incompletely distended bladder. Color and duplex Doppler ultrasound was utilized to evaluate blood flow to the ovaries. COMPARISON:  Pelvic ultrasound 02/21/2011. CT abdomen and pelvis earlier same day and 01/11/2013. FINDINGS: Uterus Measurements: 7.1 x 3.6 x 4.8 cm = volume: 63 mL. Anteverted. Homogeneous echotexture without focal fibroid or other myometrial abnormality. Normal-appearing uterine cervix. Endometrium Thickness: Approximately 7 mm. Normal appearance without evidence of endometrial fluid or mass. Right ovary Measurements: 5.1 x 2.7 x 2.9 cm =  volume: 21 mL. Small follicular cysts. No dominant cyst or solid mass. Normal color Doppler flow within the ovary. Left ovary Measurements: 4.2 x 2.5 x 1.6 cm = volume: 9 mL. Small follicular cysts. No dominant cyst or solid mass. Normal color Doppler flow within the ovary. Pulsed Doppler evaluation of both ovaries demonstrates normal low-resistance arterial and venous waveforms. Other findings Large heterogeneous collection which is contiguous with both ovaries and extends posterior to the uterus as noted on CT. There is no internal color Doppler flow within the collection, though there is adjacent hyperemia. IMPRESSION: 1. Findings which are felt to most likely represent a large abscess which is present in both adnexae and contiguous with both ovaries, extending posterior to the uterus. 2. Normal appearing uterus and  endometrium. Electronically Signed   By: Hulan Saas M.D.   On: 10/03/2018 17:08   US Pelvis Complete  Result Date: 10/03/2018 CLINICAL DATA:  27 year old presenting with three-day history of generalized abdominal pain and hematemesis. CT earlier same day questioned a possible tubo-ovarian abscess. EXAM: TRANSABDOMINAL AND TRANSVAGINAL ULTRASOUND OF PELVIS DOPPLER ULTRASOUND OF OVARIES TECHNIQUE: Both transabdominal and transvaginal ultrasound examinations of the pelvis were performed. Transabdominal technique was performed for global imaging of the pelvis including uterus, ovaries, adnexal regions, and pelvic cul-de-sac. It was necessary to proceed with endovaginal exam following the transabdominal exam to visualize the ovaries and endometrium due to the incompletely distended bladder. Color and duplex Doppler ultrasound was utilized to evaluate blood flow to the ovaries. COMPARISON:  Pelvic ultrasound 02/21/2011. CT abdomen and pelvis earlier same day and 01/11/2013. FINDINGS: Uterus Measurements: 7.1 x 3.6 x 4.8 cm = volume: 63 mL. Anteverted. Homogeneous echotexture without focal fibroid or other myometrial abnormality. Normal-appearing uterine cervix. Endometrium Thickness: Approximately 7 mm. Normal appearance without evidence of endometrial fluid or mass. Right ovary Measurements: 5.1 x 2.7 x 2.9 cm = volume: 21 mL. Small follicular cysts. No dominant cyst or solid mass. Normal color Doppler flow within the ovary. Left ovary Measurements: 4.2 x 2.5 x 1.6 cm = volume: 9 mL. Small follicular cysts. No dominant cyst or solid mass. Normal color Doppler flow within the ovary. Pulsed Doppler evaluation of both ovaries demonstrates normal low-resistance arterial and venous waveforms. Other findings Large heterogeneous collection which is contiguous with both ovaries and extends posterior to the uterus as noted on CT. There is no internal color Doppler flow within the collection, though there is adjacent  hyperemia. IMPRESSION: 1. Findings which are felt to most likely represent a large abscess which is present in both adnexae and contiguous with both ovaries, extending posterior to the uterus. 2. Normal appearing uterus and endometrium. Electronically Signed   By: Hulan Saas M.D.   On: 10/03/2018 17:08   Ct Abdomen Pelvis W Contrast  Result Date: 10/03/2018 CLINICAL DATA:  Hematemesis and abdominal pain for 3 days, dark red and clumpy vomitus, history of alcohol abuse, smoking EXAM: CT ABDOMEN AND PELVIS WITH CONTRAST TECHNIQUE: Multidetector CT imaging of the abdomen and pelvis was performed using the standard protocol following bolus administration of intravenous contrast. Sagittal and coronal MPR images reconstructed from axial data set. CONTRAST:  OMNIPAQUE IOHEXOL 300 MG/ML SOLN IV. No oral contrast. COMPARISON:  01/11/2013 FINDINGS: Lower chest: Subsegmental atelectasis at LEFT lung base Hepatobiliary: Gallbladder and liver normal appearance Pancreas: Normal appearance Spleen: Normal appearance Adrenals/Urinary Tract: Adrenal glands, kidneys, ureters, and bladder normal appearance Stomach/Bowel: Normal appendix. Wall thickening of distal sigmoid colon, likely reactive, see below. Stomach and bowel  loops otherwise normal appearance. Vascular/Lymphatic: Vascular structures patent. Reproductive: Uterus normal appearance. Multiloculated inflammatory appearing fluid collection in the adnexa and cul-de-sac most consistent with a tubo-ovarian abscess. Overall area measures approximately 9.6 x 7.3 x 6.2 cm. Mild surrounding edema/ill definition of tissue planes. Underlying mass lesion not completely excluded. Other: No free air.  No significant ascites.  No hernia. Musculoskeletal: Osseous structures unremarkable. IMPRESSION: Multiloculated fluid collection in pelvis with surrounding infiltrative changes, appears inflammatory, favor tubo-ovarian abscess. Followup until resolution recommended to  exclude underlying mass lesion of the ovaries. Likely reactive bowel wall thickening of the adjacent sigmoid colon. Electronically Signed   By: Ulyses SouthwardMark  Boles M.D.   On: 10/03/2018 14:21   [place] Art/ven Flow Abd Pelv Doppler  Result Date: 10/03/2018 CLINICAL DATA:  [age] year old presenting with three-day history of generalized abdominal pain and hematemesis. CT earlier same day questioned a possible tubo-ovarian abscess. EXAM: TRANSABDOMINAL AND TRANSVAGINAL ULTRASOUND OF PELVIS DOPPLER ULTRASOUND OF OVARIES TECHNIQUE: Both transabdominal and transvaginal ultrasound examinations of the pelvis were performed. Transabdominal technique was performed for global imaging of the pelvis including uterus, ovaries, adnexal regions, and pelvic cul-de-sac. It was necessary to proceed with endovaginal exam following the transabdominal exam to visualize the ovaries and endometrium due to the incompletely distended bladder. Color and duplex Doppler ultrasound was utilized to evaluate blood flow to the ovaries. COMPARISON:  Pelvic ultrasound 02/21/2011. CT abdomen and pelvis earlier same day and 01/11/2013. FINDINGS: Uterus Measurements: 7.1 x 3.6 x 4.8 cm = volume: 63 mL. Anteverted. Homogeneous echotexture without focal fibroid or other myometrial abnormality. Normal-appearing uterine cervix. Endometrium Thickness: Approximately 7 mm. Normal appearance without evidence of endometrial fluid or mass. Right ovary Measurements: 5.1 x 2.7 x 2.9 cm = volume: 21 mL. Small follicular cysts. No dominant cyst or solid mass. Normal color Doppler flow within the ovary. Left ovary Measurements: 4.2 x 2.5 x 1.6 cm = volume: 9 mL. Small follicular cysts. No dominant cyst or solid mass. Normal color Doppler flow within the ovary. Pulsed Doppler evaluation of both ovaries demonstrates normal low-resistance arterial and venous waveforms. Other findings Large heterogeneous collection which is contiguous with both ovaries and extends posterior to the  uterus as noted on CT. There is no internal color Doppler flow within the collection, though there is adjacent hyperemia. IMPRESSION: 1. Findings which are felt to most likely represent a large abscess which is present in both adnexae and contiguous with both ovaries, extending posterior to the uterus. 2. Normal appearing uterus and endometrium. Electronically Signed   By: Hulan Saashomas  Lawrence M.D.   On: 10/03/2018 17:08   Dg Abd Acute W/chest  Result Date: 10/03/2018 CLINICAL DATA:  Acute abdominal pain for 3 days. EXAM: DG ABDOMEN ACUTE W/ 1V CHEST COMPARISON:  06/17/2018, 06/15/2018 and prior radiographs FINDINGS: Cardiomediastinal silhouette is unremarkable. Mild peribronchial thickening noted. No pleural effusion, airspace disease or pneumothorax. Nondistended gas-filled loops of small bowel within the RIGHT abdomen noted. No dilated bowel loops, bowel obstruction or pneumoperitoneum identified. No suspicious calcifications are present. IMPRESSION: Nonspecific nonobstructive bowel gas pattern. No evidence of pneumoperitoneum. No evidence of acute cardiopulmonary disease. Electronically Signed   By: Harmon PierJeffrey  Hu M.D.   On: 10/03/2018 12:26    Procedures Procedures (including critical care time)  Medications Ordered in ED Medications  cefOXitin (MEFOXIN) 2 g in sodium chloride 0.9 % 100 mL IVPB (2 g Intravenous New Bag/Given 10/03/18 1707)  doxycycline (VIBRAMYCIN) 100 mg in sodium chloride 0.9 % 250 mL IVPB (has no administration in time  range)  sodium chloride 0.9 % bolus 1,000 mL (1,000 mLs Intravenous New Bag/Given 10/03/18 1704)  ondansetron (ZOFRAN) injection 4 mg (4 mg Intravenous Given 10/03/18 1122)  sodium chloride 0.9 % bolus 1,000 mL (0 mLs Intravenous Stopped 10/03/18 1151)  morphine 4 MG/ML injection 4 mg (4 mg Intravenous Given 10/03/18 1122)  alum & mag hydroxide-simeth (MAALOX/MYLANTA) 200-200-20 MG/5ML suspension 30 mL (30 mLs Oral Given 10/03/18 1250)  pantoprazole (PROTONIX) 80  mg in sodium chloride 0.9 % 100 mL IVPB (0 mg Intravenous Stopped 10/03/18 1312)  iohexol (OMNIPAQUE) 300 MG/ML solution 100 mL (100 mLs Intravenous Contrast Given 10/03/18 1330)     Initial Impression / Assessment and Plan / ED Course  I have reviewed the triage vital signs and the nursing notes.  Pertinent labs & imaging results that were available during my care of the patient were reviewed by me and considered in my medical decision making (see chart for details).     Patient returns for nausea vomiting and abdominal pain.  She is afebrile, vital signs are stable.  She did have some O2 desaturations after administration of morphine but improved on 2 L via nasal cannula.  She is quite uncomfortable in appearance, actively vomiting bright red blood in the ED.  Will obtain repeat lab work and imaging for further evaluation.  Lab work is significant for persistent leukocytosis, elevated total bilirubin of 2.0 which appears to be trending upward from lab work performed 4 days ago.  Remainder of LFTs, lipase, and creatinine within normal limits.  She is mildly hypokalemic.  Pregnancy test negative.  Abdominal radiographs show no evidence of obstruction or pneumoperitoneum.  CT of the abdomen and pelvis shows significant for multiloculated fluid collection in the pelvis suggestive of TOA.  Pelvic exam shows cervical motion tenderness and bilateral adnexal tenderness, worse on the left as compared to the right.  She notes that she had lower abdominal pain preceding her nausea and vomiting by several days.  Will obtain pelvic ultrasound for further evaluation.  She will be given IV cefoxitin and doxycycline.   5:23 PM Signed out to oncoming provider PA Maczis.  Awaiting results of pelvic ultrasound, wet prep, UA.  Will require OB/GYN consultation and likely admission for TOA and hematemesis. Suspect hematemesis likely secondary to Mallory-Weiss tear and not likely to be due to Boerhaave or bleeding  esophageal varices despite her history of alcohol abuse.  She may require GI consultation while in the hospital though I do not feel this needs to occur emergently as she is hemodynamically stable and is not actively vomiting at this time.  Final Clinical Impressions(s) / ED Diagnoses   Final diagnoses:  TOA (tubo-ovarian abscess)  Hematemesis with nausea    ED Discharge Orders    None      Bennye Alm 10/03/18 1726  Raeford Razor, MD 10/04/18 1136

## 2018-10-03 NOTE — H&P (Addendum)
Obstetrics & Gynecology H&P   Date of Admission: 10/03/2018   Requesting Provider: [facility]  Primary OBGYN: None Primary Care Provider: Patient, No Pcp Per  Reason for Admission: likely TOAs  History of Present Illness: Ms. Crownover is a 27 y.o. G0 (LMP: 11/23.19), with the above CC. PMHx is significant for BMI 40s, THC use, tobacco abuse, h/o polysubstance abuse.    Patient went to [facility] ED on 11/19 and [facility] ED on 11/21 for similar complaints of n/v and she states hematemesis and re-presented to the ED at [facility] with continued s/s. First imaging was done today and concerning for likely TOAs. Patient started on abx and transferred here.   No current hematemesis and no prior history. Still with mild nausea. No fevers, chills, chest pain   ROS: A 12-point review of systems was performed and negative, except as stated in the above HPI.  OBGYN History: As per HPI. OB History  Gravida Para Term Preterm AB Living  0 0 0 0 0 0  SAB TAB Ectopic Multiple Live Births  0 0 0 0 0   Periods: qmonth, regular, approx 1wk   Past Medical History: Past Medical History:  Diagnosis Date  . Alcohol abuse with alcohol-induced mood disorder (HCC) 06/08/2017  . Anxiety   . Cocaine abuse (HCC) 06/08/2017  . Depression     Past Surgical History: Past Surgical History:  Procedure Laterality Date  . NO PAST SURGERIES      Family History:  Not reviewed  Social History:  Social History   Socioeconomic History  . Marital status: Single    Spouse name: Not on file  . Number of children: Not on file  . Years of education: Not on file  . Highest education level: Not on file  Occupational History  . Not on file  Social Needs  . Financial resource strain: Not on file  . Food insecurity:    Worry: Not on file    Inability: Not on file  . Transportation needs:    Medical: Not on file    Non-medical: Not on file  Tobacco Use  . Smoking status: Current Every Day Smoker    Packs/day: 1.00    Years:  5.00    Pack years: 5.00    Types: Cigarettes  . Smokeless tobacco: Never Used  Substance and Sexual Activity  . Alcohol use: Yes    Comment: occasional   . Drug use: Yes    Types: Cocaine, Marijuana    Comment: last cocaine use  August/ Marijuana Daily  . Sexual activity: Yes    Birth control/protection: None  Lifestyle  . Physical activity:    Days per week: Not on file    Minutes per session: Not on file  . Stress: Not on file  Relationships  . Social connections:    Talks on phone: Not on file    Gets together: Not on file    Attends religious service: Not on file    Active member of club or organization: Not on file    Attends meetings of clubs or organizations: Not on file    Relationship status: Not on file  . Intimate partner violence:    Fear of current or ex partner: Not on file    Emotionally abused: Not on file    Physically abused: Not on file    Forced sexual activity: Not on file  Other Topics Concern  . Not on file  Social History Narrative  . Not  on file    Allergy: No Known Allergies  Current Outpatient Medications: Medications Prior to Admission  Medication Sig Dispense Refill Last Dose  . promethazine (PHENERGAN) 25 MG tablet Take 1 tablet (25 mg total) by mouth every 6 (six) hours as needed for nausea or vomiting. 12 tablet 0      Hospital Medications: Current Facility-Administered Medications  Medication Dose Route Frequency Provider Last Rate Last Dose  . acetaminophen (TYLENOL) tablet 650 mg  650 mg Oral Q6H PRN [place] Bing, MD      . cefOXitin (MEFOXIN) 2 g in sodium chloride 0.9 % 100 mL IVPB  2 g Intravenous Q6H [place] Bing, MD      . Melene Muller ON 10/04/2018] doxycycline (VIBRA-TABS) tablet 100 mg  100 mg Oral Q12H [place] Bing, MD      . famotidine (PEPCID) tablet 20 mg  20 mg Oral BID [place] Bing, MD   20 mg at 10/03/18 2145  . folic acid (FOLVITE) tablet 1 mg  1 mg Oral Daily [place] Bing, MD      . lactated  ringers infusion   Intravenous Continuous [place] Bing, MD 100 mL/hr at 10/03/18 2149    . LORazepam (ATIVAN) tablet 1 mg  1 mg Oral Q6H PRN [place] Bing, MD       Or  . LORazepam (ATIVAN) injection 1 mg  1 mg Intravenous Q6H PRN [place] Bing, MD      . LORazepam (ATIVAN) tablet 0-4 mg  0-4 mg Oral Q6H [place] Bing, MD       Followed by  . [START ON 10/05/2018] LORazepam (ATIVAN) tablet 0-4 mg  0-4 mg Oral Q12H [place] Bing, MD      . metroNIDAZOLE (FLAGYL) tablet 500 mg  500 mg Oral Q12H [place] Bing, MD   500 mg at 10/03/18 2145  . [START ON 10/04/2018] multivitamin with minerals tablet 1 tablet  1 tablet Oral Daily [place] Bing, MD      . oxyCODONE (Oxy IR/ROXICODONE) immediate release tablet 5-10 mg  5-10 mg Oral Q4H PRN [place] Bing, MD      . Melene Muller ON 10/04/2018] polyethylene glycol (MIRALAX / GLYCOLAX) packet 17 g  17 g Oral Daily [place] Bing, MD      . Melene Muller ON 10/04/2018] prenatal multivitamin tablet 1 tablet  1 tablet Oral Q1200 [place] Bing, MD      . prochlorperazine (COMPAZINE) injection 10 mg  10 mg Intravenous Q6H PRN [place] Bing, MD      . prochlorperazine (COMPAZINE) tablet 10 mg  10 mg Oral Q6H PRN [place] Bing, MD   10 mg at 10/03/18 2145  . senna (SENOKOT) tablet 8.6 mg  1 tablet Oral QHS PRN [place] Bing, MD      . simethicone (MYLICON) chewable tablet 80 mg  80 mg Oral QID PRN [place] Bing, MD      . Melene Muller ON 10/04/2018] thiamine (VITAMIN B-1) tablet 100 mg  100 mg Oral Daily  Bing, MD       Or  . Melene Muller ON 10/04/2018] thiamine (B-1) injection 100 mg  100 mg Intravenous Daily  Bing, MD         Physical Exam:  Current Vital Signs 24h Vital Sign Ranges  T (!) 100.4 F (38 C) Temp  Avg: 99.2 F (37.3 C)  Min: 98 F (36.7 C)  Max: 100.4 F (38 C)  BP 106/72 BP  Min: 106/72  Max: 138/87  HR 72 Pulse  Avg: 74  Min: 58  Max: 81  RR 19 Resp  Avg: 18.1  Min: 13  Max: 24  SaO2 94 % Room Air  SpO2  Avg: 96.1 %  Min: 84 %  Max: 100 %       24 Hour I/O Current Shift I/O  Time Ins Outs No intake/output data recorded. 11/23 1901 - 11/24 0700 In: 1100  Out: -    Patient Vitals for the past 24 hrs:  BP Temp Temp src Pulse Resp SpO2 Weight  10/03/18 2012 106/72 (!) 100.4 F (38 C) Oral 72 19 94 % -  10/03/18 1930 115/65 - - 78 - 94 % -  10/03/18 1830 126/77 - - 74 - 99 % -  10/03/18 1745 108/70 - - 80 - 97 % -  10/03/18 1730 123/75 - - 72 - 96 % -  10/03/18 1715 124/75 - - 81 15 94 % -  10/03/18 1615 138/87 - - 76 (!) 24 98 % -  10/03/18 1330 - - - 64 17 99 % -  10/03/18 1136 129/73 98 F (36.7 C) Oral (!) 58 15 100 % -  10/03/18 1130 129/73 - - 80 (!) 21 (!) 84 % -  10/03/18 1115 129/70 - - 79 (!) 21 100 % -  10/03/18 1102 - - - - - - 113.4 kg  10/03/18 1100 116/73 - - - 13 - -  10/03/18 1058 - - - - - 98 % -    Body mass index is 40.35 kg/m. General appearance: Well nourished, well developed female. tired Cardiovascular: S1, S2 normal, no murmur, rub or gallop, regular rate and rhythm Respiratory:  Clear to auscultation bilateral. Normal respiratory effort Abdomen: positive bowel sounds and no masses, hernias; diffusely non tender to palpation, non distended Neuro/Psych:  Normal mood and affect.  Skin:  Warm and dry.  Extremities: no clubbing, cyanosis, or edema.   Laboratory: Pending: GC/CT, HIV, rpr, UCx UDS: thc and opiates (likely from ED) Beta HCG, wet prep: negative Recent Labs  Lab 09/29/18 0830 10/01/18 0017 10/03/18 1111  WBC 22.8* 18.1* 19.5*  HGB 13.9 12.3 13.0  HCT 42.9 38.9 41.6  PLT 375 389 437*   Recent Labs  Lab 09/29/18 0830 10/01/18 0017 10/03/18 1145  NA 138 142 138  K 4.6 3.4* 3.2*  4.6  CL 108 106 106  CO2 19* 26 22  BUN 10 10 8   CREATININE 0.98 0.70 0.78  CALCIUM 8.9 8.0* 8.2*  PROT 6.9  --  6.1*  BILITOT 1.4*  --  2.0*  ALKPHOS 78  --  62  ALT 18  --  11  AST 29  --  36  GLUCOSE 120* 121* 126*   No results for  input(s): APTT, INR, PTT in the last 168 hours.  Invalid input(s): DRHAPTT Recent Labs  Lab 10/03/18 1105  ABORH O POS Performed at [facility], [address], [place], [place] [address]   O POS    Imaging:  CLINICAL DATA:  27 year old presenting with three-day history of generalized abdominal pain and hematemesis. CT earlier same day questioned a possible tubo-ovarian abscess.  EXAM: TRANSABDOMINAL AND TRANSVAGINAL ULTRASOUND OF PELVIS  DOPPLER ULTRASOUND OF OVARIES  TECHNIQUE: Both transabdominal and transvaginal ultrasound examinations of the pelvis were performed. Transabdominal technique was performed for global imaging of the pelvis including uterus, ovaries, adnexal regions, and pelvic cul-de-sac.  It was necessary to proceed with endovaginal exam following the transabdominal exam to visualize the ovaries and endometrium due to the incompletely distended  bladder. Color and duplex Doppler ultrasound was utilized to evaluate blood flow to the ovaries.  COMPARISON:  Pelvic ultrasound 02/21/2011. CT abdomen and pelvis earlier same day and 01/11/2013.  FINDINGS: Uterus  Measurements: 7.1 x 3.6 x 4.8 cm = volume: 63 mL. Anteverted. Homogeneous echotexture without focal fibroid or other myometrial abnormality. Normal-appearing uterine cervix.  Endometrium  Thickness: Approximately 7 mm. Normal appearance without evidence of endometrial fluid or mass.  Right ovary  Measurements: 5.1 x 2.7 x 2.9 cm = volume: 21 mL. Small follicular cysts. No dominant cyst or solid mass. Normal color Doppler flow within the ovary.  Left ovary  Measurements: 4.2 x 2.5 x 1.6 cm = volume: 9 mL. Small follicular cysts. No dominant cyst or solid mass. Normal color Doppler flow within the ovary.  Pulsed Doppler evaluation of both ovaries demonstrates normal low-resistance arterial and venous waveforms.  Other findings  Large heterogeneous collection  which is contiguous with both ovaries and extends posterior to the uterus as noted on CT. There is no internal color Doppler flow within the collection, though there is adjacent hyperemia.  IMPRESSION: 1. Findings which are felt to most likely represent a large abscess which is present in both adnexae and contiguous with both ovaries, extending posterior to the uterus. 2. Normal appearing uterus and endometrium.   Electronically Signed   By: Hulan Saashomas  Lawrence M.D.   On: 10/03/2018 17:08  CLINICAL DATA:  Hematemesis and abdominal pain for 3 days, dark red and clumpy vomitus, history of alcohol abuse, smoking  EXAM: CT ABDOMEN AND PELVIS WITH CONTRAST  TECHNIQUE: Multidetector CT imaging of the abdomen and pelvis was performed using the standard protocol following bolus administration of intravenous contrast. Sagittal and coronal MPR images reconstructed from axial data set.  CONTRAST:  100mL OMNIPAQUE IOHEXOL 300 MG/ML SOLN IV. No oral contrast.  COMPARISON:  01/11/2013  FINDINGS: Lower chest: Subsegmental atelectasis at LEFT lung base  Hepatobiliary: Gallbladder and liver normal appearance  Pancreas: Normal appearance  Spleen: Normal appearance  Adrenals/Urinary Tract: Adrenal glands, kidneys, ureters, and bladder normal appearance  Stomach/Bowel: Normal appendix. Wall thickening of distal sigmoid colon, likely reactive, see below. Stomach and bowel loops otherwise normal appearance.  Vascular/Lymphatic: Vascular structures patent.  Reproductive: Uterus normal appearance. Multiloculated inflammatory appearing fluid collection in the adnexa and cul-de-sac most consistent with a tubo-ovarian abscess. Overall area measures approximately 9.6 x 7.3 x 6.2 cm. Mild surrounding edema/ill definition of tissue planes. Underlying mass lesion not completely excluded.  Other: No free air.  No significant ascites.  No hernia.  Musculoskeletal: Osseous  structures unremarkable.  IMPRESSION: Multiloculated fluid collection in pelvis with surrounding infiltrative changes, appears inflammatory, favor tubo-ovarian abscess.  Followup until resolution recommended to exclude underlying mass lesion of the ovaries.  Likely reactive bowel wall thickening of the adjacent sigmoid colon.   Electronically Signed   By: Ulyses SouthwardMark  Boles M.D.   On: 10/03/2018 14:21   Assessment: Ms. Katrinka BlazingSmith is a 27 y.o. G0 likely with s/s related to TOAs. Pt stable  Plan: Admit to GYN. Continue IV/PO abx. It appears patient had temp on arrival will. Will recheck and if febrile still will get BCx x 2. Diet as tolerated. PPI, SCDs. Rpt labs in AM. Avoid NSAIDs for now  Patient states no h/o recent etoh abuse. Will do ciwa protocol  Total time taking care of the patient was 30 minutes, with greater than 50% of the time spent in face to face interaction with the patient.  [place] Bingharlie Nayanna Seaborn, Montez HagemanJr.  MD Attending [facility] for [employer])

## 2018-10-03 NOTE — ED Notes (Signed)
Pt to ultrasound at this time.

## 2018-10-03 NOTE — ED Provider Notes (Signed)
Care assumed from [facility], PA-C at shift change with consult to [facility] and Hospitalist pending. Please see her note for further details.   In brief, this patient is a 27 y.o. female with a reported history of trichomonas as well as alcohol abuse who presents emergency department today for pelvic pain, nausea/vomiting.  Patient reports that approximately 2 weeks ago she began having lower pelvic pain that was worse on the left side.  She reports 1 week after this she began having daily nausea as well as nonbilious, nonbloody emesis.  She reports that 4 days ago this however did turn to bloody emesis that was originally blood-streaked and has since become more gross.  She notes her last episode was this morning around 8 AM.  She reports her nausea is currently controlled with antiemetics in the department.  She denies history of similar in the past.  No upper abdominal pain.  She does report recently cutting back on alcohol use, however is still a daily drinker.    Patient has been hemodynamically stable in the department.  No fever, tachycardia, tachypnea, hypoxia or hypotension.  Lab work is significant for leukocytosis.  Patient also with a mild hypokalemia but otherwise unremarkable.  CT scan was done and showed suspicion for TOA.  Pelvic exam was done following had a cervical motion tenderness as well as bilateral adnexal tenderness is worse than the left.  Pelvic ultrasound was ordered to evaluate.  Cefoxitin and doxycycline were ordered to cover for likely TOA.  PLAN: Will plan to treat for TOA placed on WBC, exam and CT. [place] pending. Plan to consult OBGYN.   Gen: afebrile, VSS HEENT: Atraumatic, EOMI Resp: no resp distress CV: RRR Abd: soft, NT, ND MsK: moving all extremities well Neuro: A&O x4  Results for orders placed or performed during the hospital encounter of 10/03/18  Wet prep, genital  Result Value Ref Range   Yeast Wet Prep HPF POC NONE SEEN NONE SEEN   Trich, Wet Prep NONE SEEN  NONE SEEN   Clue Cells Wet Prep HPF POC NONE SEEN NONE SEEN   WBC, Wet Prep HPF POC MANY (A) NONE SEEN   Sperm NONE SEEN   CBC with Differential  Result Value Ref Range   WBC 19.5 (H) 4.0 - 10.5 K/uL   RBC 4.50 3.87 - 5.11 MIL/uL   Hemoglobin 13.0 12.0 - 15.0 g/dL   HCT 16.1 09.6 - 04.5 %   MCV 92.4 80.0 - 100.0 fL   MCH 28.9 26.0 - 34.0 pg   MCHC 31.3 30.0 - 36.0 g/dL   RDW 40.9 81.1 - 91.4 %   Platelets 437 (H) 150 - 400 K/uL   nRBC 0.0 0.0 - 0.2 %   Neutrophils Relative % 74 %   Neutro Abs 14.3 (H) 1.7 - 7.7 K/uL   Lymphocytes Relative 18 %   Lymphs Abs 3.5 0.7 - 4.0 K/uL   Monocytes Relative 7 %   Monocytes Absolute 1.3 (H) 0.1 - 1.0 K/uL   Eosinophils Relative 0 %   Eosinophils Absolute 0.0 0.0 - 0.5 K/uL   Basophils Relative 0 %   Basophils Absolute 0.1 0.0 - 0.1 K/uL   Immature Granulocytes 1 %   Abs Immature Granulocytes 0.27 (H) 0.00 - 0.07 K/uL  Urinalysis, Routine w reflex microscopic  Result Value Ref Range   Color, Urine YELLOW YELLOW   APPearance CLEAR CLEAR   Specific Gravity, Urine >1.046 (H) 1.005 - 1.030   pH 7.0 5.0 -  8.0   Glucose, UA NEGATIVE NEGATIVE mg/dL   Hgb urine dipstick LARGE (A) NEGATIVE   Bilirubin Urine NEGATIVE NEGATIVE   Ketones, ur 5 (A) NEGATIVE mg/dL   Protein, ur NEGATIVE NEGATIVE mg/dL   Nitrite NEGATIVE NEGATIVE   Leukocytes, UA NEGATIVE NEGATIVE   RBC / HPF 0-5 0 - 5 RBC/hpf   WBC, UA 0-5 0 - 5 WBC/hpf   Bacteria, UA NONE SEEN NONE SEEN   Squamous Epithelial / LPF 0-5 0 - 5   Mucus PRESENT   Comprehensive metabolic panel  Result Value Ref Range   Sodium 138 135 - 145 mmol/L   Potassium 4.6 3.5 - 5.1 mmol/L   Chloride 106 98 - 111 mmol/L   CO2 22 22 - 32 mmol/L   Glucose, Bld 126 (H) 70 - 99 mg/dL   BUN 8 6 - 20 mg/dL   Creatinine, Ser 1.610.78 0.44 - 1.00 mg/dL   Calcium 8.2 (L) 8.9 - 10.3 mg/dL   Total Protein 6.1 (L) 6.5 - 8.1 g/dL   Albumin 2.7 (L) 3.5 - 5.0 g/dL   AST 36 15 - 41 U/L   ALT 11 0 - 44 U/L   Alkaline  Phosphatase 62 38 - 126 U/L   Total Bilirubin 2.0 (H) 0.3 - 1.2 mg/dL   GFR calc non Af Amer >60 >60 mL/min   GFR calc Af Amer >60 >60 mL/min   Anion gap 10 5 - 15  Lipase, blood  Result Value Ref Range   Lipase 27 11 - 51 U/L  Potassium  Result Value Ref Range   Potassium 3.2 (L) 3.5 - 5.1 mmol/L  Urine rapid drug screen (hosp performed)  Result Value Ref Range   Opiates POSITIVE (A) NONE DETECTED   Cocaine NONE DETECTED NONE DETECTED   Benzodiazepines NONE DETECTED NONE DETECTED   Amphetamines NONE DETECTED NONE DETECTED   Tetrahydrocannabinol POSITIVE (A) NONE DETECTED   Barbiturates NONE DETECTED NONE DETECTED  I-Stat beta hCG blood, ED  Result Value Ref Range   I-stat hCG, quantitative <5.0 <5 mIU/mL   Comment 3          Type and screen [facility]  Result Value Ref Range   ABO/RH(D) O POS    Antibody Screen NEG    Sample Expiration      10/06/2018 Performed at [facility], [address] [place], [place] [address]   ABO/Rh  Result Value Ref Range   ABO/RH(D)      O POS Performed at [facility], [address] [place], [place] [address]    [place] Transvaginal Non-ob  Result Date: 10/03/2018 CLINICAL DATA:  27 year old presenting with three-day history of generalized abdominal pain and hematemesis. CT earlier same day questioned a possible tubo-ovarian abscess. EXAM: TRANSABDOMINAL AND TRANSVAGINAL ULTRASOUND OF PELVIS DOPPLER ULTRASOUND OF OVARIES TECHNIQUE: Both transabdominal and transvaginal ultrasound examinations of the pelvis were performed. Transabdominal technique was performed for global imaging of the pelvis including uterus, ovaries, adnexal regions, and pelvic cul-de-sac. It was necessary to proceed with endovaginal exam following the transabdominal exam to visualize the ovaries and endometrium due to the incompletely distended bladder. Color and duplex Doppler ultrasound was utilized to evaluate blood flow to the ovaries.  COMPARISON:  Pelvic ultrasound 02/21/2011. CT abdomen and pelvis earlier same day and 01/11/2013. FINDINGS: Uterus Measurements: 7.1 x 3.6 x 4.8 cm = volume: 63 mL. Anteverted. Homogeneous echotexture without focal fibroid or other myometrial abnormality. Normal-appearing uterine cervix. Endometrium Thickness:  Approximately 7 mm. Normal appearance without evidence of endometrial fluid or mass. Right ovary Measurements: 5.1 x 2.7 x 2.9 cm = volume: 21 mL. Small follicular cysts. No dominant cyst or solid mass. Normal color Doppler flow within the ovary. Left ovary Measurements: 4.2 x 2.5 x 1.6 cm = volume: 9 mL. Small follicular cysts. No dominant cyst or solid mass. Normal color Doppler flow within the ovary. Pulsed Doppler evaluation of both ovaries demonstrates normal low-resistance arterial and venous waveforms. Other findings Large heterogeneous collection which is contiguous with both ovaries and extends posterior to the uterus as noted on CT. There is no internal color Doppler flow within the collection, though there is adjacent hyperemia. IMPRESSION: 1. Findings which are felt to most likely represent a large abscess which is present in both adnexae and contiguous with both ovaries, extending posterior to the uterus. 2. Normal appearing uterus and endometrium. Electronically Signed   By: Hulan Saas M.D.   On: 10/03/2018 17:08   US Pelvis Complete  Result Date: 10/03/2018 CLINICAL DATA:  27 year old presenting with three-day history of generalized abdominal pain and hematemesis. CT earlier same day questioned a possible tubo-ovarian abscess. EXAM: TRANSABDOMINAL AND TRANSVAGINAL ULTRASOUND OF PELVIS DOPPLER ULTRASOUND OF OVARIES TECHNIQUE: Both transabdominal and transvaginal ultrasound examinations of the pelvis were performed. Transabdominal technique was performed for global imaging of the pelvis including uterus, ovaries, adnexal regions, and pelvic cul-de-sac. It was necessary to proceed with  endovaginal exam following the transabdominal exam to visualize the ovaries and endometrium due to the incompletely distended bladder. Color and duplex Doppler ultrasound was utilized to evaluate blood flow to the ovaries. COMPARISON:  Pelvic ultrasound 02/21/2011. CT abdomen and pelvis earlier same day and 01/11/2013. FINDINGS: Uterus Measurements: 7.1 x 3.6 x 4.8 cm = volume: 63 mL. Anteverted. Homogeneous echotexture without focal fibroid or other myometrial abnormality. Normal-appearing uterine cervix. Endometrium Thickness: Approximately 7 mm. Normal appearance without evidence of endometrial fluid or mass. Right ovary Measurements: 5.1 x 2.7 x 2.9 cm = volume: 21 mL. Small follicular cysts. No dominant cyst or solid mass. Normal color Doppler flow within the ovary. Left ovary Measurements: 4.2 x 2.5 x 1.6 cm = volume: 9 mL. Small follicular cysts. No dominant cyst or solid mass. Normal color Doppler flow within the ovary. Pulsed Doppler evaluation of both ovaries demonstrates normal low-resistance arterial and venous waveforms. Other findings Large heterogeneous collection which is contiguous with both ovaries and extends posterior to the uterus as noted on CT. There is no internal color Doppler flow within the collection, though there is adjacent hyperemia. IMPRESSION: 1. Findings which are felt to most likely represent a large abscess which is present in both adnexae and contiguous with both ovaries, extending posterior to the uterus. 2. Normal appearing uterus and endometrium. Electronically Signed   By: Hulan Saas M.D.   On: 10/03/2018 17:08   Ct Abdomen Pelvis W Contrast  Result Date: 10/03/2018 CLINICAL DATA:  Hematemesis and abdominal pain for 3 days, dark red and clumpy vomitus, history of alcohol abuse, smoking EXAM: CT ABDOMEN AND PELVIS WITH CONTRAST TECHNIQUE: Multidetector CT imaging of the abdomen and pelvis was performed using the standard protocol following bolus administration of  intravenous contrast. Sagittal and coronal MPR images reconstructed from axial data set. CONTRAST:  OMNIPAQUE IOHEXOL 300 MG/ML SOLN IV. No oral contrast. COMPARISON:  01/11/2013 FINDINGS: Lower chest: Subsegmental atelectasis at LEFT lung base Hepatobiliary: Gallbladder and liver normal appearance Pancreas: Normal appearance Spleen: Normal appearance Adrenals/Urinary Tract: Adrenal glands,  kidneys, ureters, and bladder normal appearance Stomach/Bowel: Normal appendix. Wall thickening of distal sigmoid colon, likely reactive, see below. Stomach and bowel loops otherwise normal appearance. Vascular/Lymphatic: Vascular structures patent. Reproductive: Uterus normal appearance. Multiloculated inflammatory appearing fluid collection in the adnexa and cul-de-sac most consistent with a tubo-ovarian abscess. Overall area measures approximately 9.6 x 7.3 x 6.2 cm. Mild surrounding edema/ill definition of tissue planes. Underlying mass lesion not completely excluded. Other: No free air.  No significant ascites.  No hernia. Musculoskeletal: Osseous structures unremarkable. IMPRESSION: Multiloculated fluid collection in pelvis with surrounding infiltrative changes, appears inflammatory, favor tubo-ovarian abscess. Followup until resolution recommended to exclude underlying mass lesion of the ovaries. Likely reactive bowel wall thickening of the adjacent sigmoid colon. Electronically Signed   By: Ulyses Southward M.D.   On: 10/03/2018 14:21   [place] Art/ven Flow Abd Pelv Doppler  Result Date: 10/03/2018 CLINICAL DATA:  27 year old presenting with three-day history of generalized abdominal pain and hematemesis. CT earlier same day questioned a possible tubo-ovarian abscess. EXAM: TRANSABDOMINAL AND TRANSVAGINAL ULTRASOUND OF PELVIS DOPPLER ULTRASOUND OF OVARIES TECHNIQUE: Both transabdominal and transvaginal ultrasound examinations of the pelvis were performed. Transabdominal technique was performed for global imaging of the  pelvis including uterus, ovaries, adnexal regions, and pelvic cul-de-sac. It was necessary to proceed with endovaginal exam following the transabdominal exam to visualize the ovaries and endometrium due to the incompletely distended bladder. Color and duplex Doppler ultrasound was utilized to evaluate blood flow to the ovaries. COMPARISON:  Pelvic ultrasound 02/21/2011. CT abdomen and pelvis earlier same day and 01/11/2013. FINDINGS: Uterus Measurements: 7.1 x 3.6 x 4.8 cm = volume: 63 mL. Anteverted. Homogeneous echotexture without focal fibroid or other myometrial abnormality. Normal-appearing uterine cervix. Endometrium Thickness: Approximately 7 mm. Normal appearance without evidence of endometrial fluid or mass. Right ovary Measurements: 5.1 x 2.7 x 2.9 cm = volume: 21 mL. Small follicular cysts. No dominant cyst or solid mass. Normal color Doppler flow within the ovary. Left ovary Measurements: 4.2 x 2.5 x 1.6 cm = volume: 9 mL. Small follicular cysts. No dominant cyst or solid mass. Normal color Doppler flow within the ovary. Pulsed Doppler evaluation of both ovaries demonstrates normal low-resistance arterial and venous waveforms. Other findings Large heterogeneous collection which is contiguous with both ovaries and extends posterior to the uterus as noted on CT. There is no internal color Doppler flow within the collection, though there is adjacent hyperemia. IMPRESSION: 1. Findings which are felt to most likely represent a large abscess which is present in both adnexae and contiguous with both ovaries, extending posterior to the uterus. 2. Normal appearing uterus and endometrium. Electronically Signed   By: Hulan Saas M.D.   On: 10/03/2018 17:08   Dg Abd Acute W/chest  Result Date: 10/03/2018 CLINICAL DATA:  Acute abdominal pain for 3 days. EXAM: DG ABDOMEN ACUTE W/ 1V CHEST COMPARISON:  06/17/2018, 06/15/2018 and prior radiographs FINDINGS: Cardiomediastinal silhouette is unremarkable. Mild  peribronchial thickening noted. No pleural effusion, airspace disease or pneumothorax. Nondistended gas-filled loops of small bowel within the RIGHT abdomen noted. No dilated bowel loops, bowel obstruction or pneumoperitoneum identified. No suspicious calcifications are present. IMPRESSION: Nonspecific nonobstructive bowel gas pattern. No evidence of pneumoperitoneum. No evidence of acute cardiopulmonary disease. Electronically Signed   By: Harmon Pier M.D.   On: 10/03/2018 12:26    MDM: 5:39 PM Ultrasound reviewed and c/w TOA. Spoke with Dr. Vergie Living of OBGYN. Recommended continuing doxycycline, cefoxitin and adding Flagyl 500 mg twice daily.  If p.o.  is tolerated can have Flagyl and doxycycline p.o.  Patient does not need to remain n.p.o. and can have clears if tolerated.  Plan for admission and transfer to women's under Dr. Vergie Living.  He requested a urine culture and UDS he added on.  I have ordered these.  Patient made aware and agrees with plan. Patient transferred to women's in stable condition.   1. Tubo-ovarian abscess   2. Hematemesis with nausea       Princella Pellegrini 10/03/18 2136    Gerhard Munch, MD 10/03/18 [phone]

## 2018-10-03 NOTE — ED Triage Notes (Signed)
Pt in from home via [facility] with c/o hematemesis, abd pain x 3 days. States emesis is dark red and "clumpy". [facility]. Given 4mg  Zofran en route. C/o mid upper abdominal pain

## 2018-10-03 NOTE — ED Notes (Signed)
Pt desaturated to 86 with good pleth, placed on 2L Gas

## 2018-10-04 ENCOUNTER — Encounter (HOSPITAL_COMMUNITY): Payer: Self-pay

## 2018-10-04 LAB — BASIC METABOLIC PANEL
Anion gap: 8 (ref 5–15)
BUN: 6 mg/dL (ref 6–20)
CHLORIDE: 106 mmol/L (ref 98–111)
CO2: 23 mmol/L (ref 22–32)
CREATININE: 0.87 mg/dL (ref 0.44–1.00)
Calcium: 8 mg/dL — ABNORMAL LOW (ref 8.9–10.3)
GFR calc non Af Amer: >60 mL/min (ref >60)
Glucose, Bld: 104 mg/dL — ABNORMAL HIGH (ref 70–99)
Potassium: 3.1 mmol/L — ABNORMAL LOW (ref 3.5–5.1)
SODIUM: 137 mmol/L (ref 135–145)

## 2018-10-04 LAB — CBC WITH DIFFERENTIAL/PLATELET
BASOS ABS: 0 10*3/uL (ref 0.0–0.1)
BASOS PCT: 0 %
Eosinophils Absolute: 0.1 10*3/uL (ref 0.0–0.5)
Eosinophils Relative: 0 %
HEMATOCRIT: 37.1 % (ref 36.0–46.0)
Hemoglobin: 11.8 g/dL — ABNORMAL LOW (ref 12.0–15.0)
Lymphocytes Relative: 15 %
Lymphs Abs: 2.7 10*3/uL (ref 0.7–4.0)
MCH: 29.9 pg (ref 26.0–34.0)
MCHC: 31.8 g/dL (ref 30.0–36.0)
MCV: 93.9 fL (ref 80.0–100.0)
MONO ABS: 1.2 10*3/uL — AB (ref 0.1–1.0)
Monocytes Relative: 7 %
NEUTROS ABS: 13.9 10*3/uL — AB (ref 1.7–7.7)
NEUTROS PCT: 78 %
NRBC: 0 % (ref 0.0–0.2)
Platelets: 362 10*3/uL (ref 150–400)
RBC: 3.95 MIL/uL (ref 3.87–5.11)
RDW: 13.6 % (ref 11.5–15.5)
WBC: 17.9 10*3/uL — AB (ref 4.0–10.5)

## 2018-10-04 LAB — TYPE AND SCREEN
ABO/RH(D): O POS
ANTIBODY SCREEN: NEGATIVE

## 2018-10-04 LAB — URINE CULTURE: Culture: <10000 — AB

## 2018-10-04 LAB — RPR: RPR Ser Ql: NONREACTIVE

## 2018-10-04 LAB — HIV ANTIBODY (ROUTINE TESTING W REFLEX): HIV Screen 4th Generation wRfx: NONREACTIVE

## 2018-10-04 LAB — ABO/RH: ABO/RH(D): O POS

## 2018-10-04 MED ORDER — PROMETHAZINE HCL 25 MG/ML IJ SOLN
25.0000 mg | Freq: Once | INTRAMUSCULAR | Status: DC
  Filled 2018-10-04: qty 1

## 2018-10-04 MED ORDER — LACTATED RINGERS IV SOLN
INTRAVENOUS | Status: AC
  Administered 2018-10-04 – 2018-10-05 (×2): via INTRAVENOUS

## 2018-10-04 NOTE — Progress Notes (Signed)
Gynecology Progress Note  Admission Date: 10/03/2018 Current Date: 10/04/2018 7:27 AM  Jill Zamora is a 27 y.o. G0 (LMP: 11/23) HD#2 admitted for TOAs/pelvic abscess   History complicated by: Patient Active Problem List   Diagnosis Date Noted  . TOA (tubo-ovarian abscess) 10/03/2018  . Marijuana abuse 10/03/2018    ROS and patient/family/surgical history, located on admission H&P note dated 10/03/2018, have been reviewed, and there are no changes except as noted below Yesterday/Overnight Events:  None. Pt able to get some sleep last night  Subjective:  Still with nausea w/o vomiting. S/s stable  Objective:    Current Vital Signs 24h Vital Sign Ranges  T 98.3 F (36.8 C) Temp  Avg: 98.9 F (37.2 C)  Min: 98 F (36.7 C)  Max: 100.4 F (38 C)  BP 125/81 BP  Min: 106/72  Max: 138/87  HR 82 Pulse  Avg: 75.3  Min: 58  Max: 83  RR 17 Resp  Avg: 17.9  Min: 13  Max: 24  SaO2 95 % Room Air SpO2  Avg: 95.9 %  Min: 84 %  Max: 100 %       24 Hour I/O Current Shift I/O  Time Ins Outs 11/23 0701 - 11/24 0700 In: 1561.9  Out: -  No intake/output data recorded.   Patient Vitals for the past 24 hrs:  BP Temp Temp src Pulse Resp SpO2 Weight  10/04/18 0524 125/81 98.3 F (36.8 C) - 82 17 95 % -  10/03/18 2254 123/61 98.9 F (37.2 C) Oral 83 17 94 % -  10/03/18 2012 106/72 (!) 100.4 F (38 C) Oral 72 19 94 % -  10/03/18 1930 115/65 - - 78 - 94 % -  10/03/18 1830 126/77 - - 74 - 99 % -  10/03/18 1745 108/70 - - 80 - 97 % -  10/03/18 1730 123/75 - - 72 - 96 % -  10/03/18 1715 124/75 - - 81 15 94 % -  10/03/18 1615 138/87 - - 76 (!) 24 98 % -  10/03/18 1330 - - - 64 17 99 % -  10/03/18 1136 129/73 98 F (36.7 C) Oral (!) 58 15 100 % -  10/03/18 1130 129/73 - - 80 (!) 21 (!) 84 % -  10/03/18 1115 129/70 - - 79 (!) 21 100 % -  10/03/18 1102 - - - - - - 113.4 kg  10/03/18 1100 116/73 - - - 13 - -  10/03/18 1058 - - - - - 98 % -    Physical exam: General appearance: alert,  cooperative and appears stated age Abdomen: soft, nd. No peritoneal s/s.  GU: No gross VB Lungs: clear to auscultation bilaterally Heart: S1, S2 normal, no murmur, rub or gallop, regular rate and rhythm Extremities: no c/c/e Skin: warm and dry Psych: appropriate Neurologic: Grossly normal  Medications Current Facility-Administered Medications  Medication Dose Route Frequency Provider Last Rate Last Dose  . acetaminophen (TYLENOL) tablet 650 mg  650 mg Oral Q6H PRN [place] BingPickens, Karagan Lehr, MD      . cefOXitin (MEFOXIN) 2 g in sodium chloride 0.9 % 100 mL IVPB  2 g Intravenous Q6H [place] BingPickens, Emanuele Mcwhirter, MD 200 mL/hr at 10/04/18 0544 2 g at 10/04/18 0544  . doxycycline (VIBRA-TABS) tablet 100 mg  100 mg Oral Q12H [place] BingPickens, Divon Krabill, MD      . famotidine (PEPCID) tablet 20 mg  20 mg Oral BID [place] BingPickens, Illias Pantano, MD   20 mg at 10/03/18 2145  . folic  acid (FOLVITE) tablet 1 mg  1 mg Oral Daily [place] Bing, MD      . lactated ringers infusion   Intravenous Continuous [place] Bing, MD 100 mL/hr at 10/03/18 2149    . LORazepam (ATIVAN) tablet 1 mg  1 mg Oral Q6H PRN [place] Bing, MD       Or  . LORazepam (ATIVAN) injection 1 mg  1 mg Intravenous Q6H PRN [place] Bing, MD      . LORazepam (ATIVAN) tablet 0-4 mg  0-4 mg Oral Q6H [place] Bing, MD       Followed by  . [START ON 10/05/2018] LORazepam (ATIVAN) tablet 0-4 mg  0-4 mg Oral Q12H [place] Bing, MD      . metroNIDAZOLE (FLAGYL) tablet 500 mg  500 mg Oral Q12H [place] Bing, MD   500 mg at 10/03/18 2145  . multivitamin with minerals tablet 1 tablet  1 tablet Oral Daily [place] Bing, MD      . ondansetron [facility]) injection 4 mg  4 mg Intravenous Q8H PRN [place] Bing, MD   4 mg at 10/04/18 0542  . ondansetron (ZOFRAN-ODT) disintegrating tablet 4 mg  4 mg Oral Q8H PRN [place] Bing, MD      . oxyCODONE (Oxy IR/ROXICODONE) immediate release tablet 5-10 mg  5-10 mg Oral Q4H PRN [place] Bing, MD      . polyethylene glycol  (MIRALAX / GLYCOLAX) packet 17 g  17 g Oral Daily [place] Bing, MD      . prenatal multivitamin tablet 1 tablet  1 tablet Oral Q1200 [place] Bing, MD      . prochlorperazine (COMPAZINE) injection 10 mg  10 mg Intravenous Q6H PRN [place] Bing, MD      . prochlorperazine (COMPAZINE) tablet 10 mg  10 mg Oral Q6H PRN [place] Bing, MD   10 mg at 10/03/18 2145  . senna (SENOKOT) tablet 8.6 mg  1 tablet Oral QHS PRN [place] Bing, MD      . simethicone (MYLICON) chewable tablet 80 mg  80 mg Oral QID PRN [place] Bing, MD      . thiamine (VITAMIN B-1) tablet 100 mg  100 mg Oral Daily [place] Bing, MD       Or  . thiamine (B-1) injection 100 mg  100 mg Intravenous Daily [place] Bing, MD          Labs  Recent Labs  Lab 10/01/18 0017 10/03/18 1111 10/04/18 0657  WBC 18.1* 19.5* 17.9*  HGB 12.3 13.0 11.8*  HCT 38.9 41.6 37.1  PLT 389 437* 362    Recent Labs  Lab 09/29/18 0830 10/01/18 0017 10/03/18 1145  NA 138 142 138  K 4.6 3.4* 3.2*  4.6  CL 108 106 106  CO2 19* 26 22  BUN 10 10 8   CREATININE 0.98 0.70 0.78  CALCIUM 8.9 8.0* 8.2*  PROT 6.9  --  6.1*  BILITOT 1.4*  --  2.0*  ALKPHOS 78  --  62  ALT 18  --  11  AST 29  --  36  GLUCOSE 120* 121* 126*    Radiology No new imaging  Assessment & Plan:  Pt stable *GYN: continue abx D#2. Will get coags tomorrow and make pt NPO after MN and talk to IR to see about possible drainage give their size *Pain: continue with PO meds *FEN/GI: regular diet, MIVF, PO and IV anti-emetics and recheck BMP and supplement K PRN *PPx: SCDs, OOB ad lib *Dispo: when feeling better and transitioned to  PO meds  Code Status: Full Code  Total time taking care of the patient was 20 minutes, with greater than 50% of the time spent in face to face interaction with the patient.  Cornelia Copa MD Attending Center for [facility])

## 2018-10-04 NOTE — Plan of Care (Signed)
  Problem: Pain Managment: Goal: General experience of comfort will improve Outcome: Progressing   Problem: Safety: Goal: Ability to remain free from injury will improve Outcome: Progressing   

## 2018-10-05 ENCOUNTER — Ambulatory Visit (HOSPITAL_COMMUNITY)
Admit: 2018-10-05 | Discharge: 2018-10-05 | Disposition: A | Discharge status: Home / Self Care | Payer: Self-pay | Attending: Radiology | Admitting: Radiology

## 2018-10-05 ENCOUNTER — Ambulatory Visit (HOSPITAL_COMMUNITY)
Admit: 2018-10-05 | Discharge: 2018-10-05 | Disposition: A | Discharge status: Home / Self Care | Payer: Self-pay | Attending: Diagnostic Radiology | Admitting: Diagnostic Radiology

## 2018-10-05 ENCOUNTER — Encounter (HOSPITAL_COMMUNITY): Payer: Self-pay

## 2018-10-05 DIAGNOSIS — E876 Hypokalemia: Secondary | ICD-10-CM | POA: Diagnosis present

## 2018-10-05 LAB — CBC WITH DIFFERENTIAL/PLATELET
BASOS ABS: 0.1 10*3/uL (ref 0.0–0.1)
Basophils Relative: 0 %
Eosinophils Absolute: 0.1 10*3/uL (ref 0.0–0.5)
Eosinophils Relative: 1 %
HCT: 36.7 % (ref 36.0–46.0)
Hemoglobin: 11.9 g/dL — ABNORMAL LOW (ref 12.0–15.0)
Lymphocytes Relative: 21 %
Lymphs Abs: 3.6 10*3/uL (ref 0.7–4.0)
MCH: 30.1 pg (ref 26.0–34.0)
MCHC: 32.4 g/dL (ref 30.0–36.0)
MCV: 92.9 fL (ref 80.0–100.0)
MONOS PCT: 6 %
Monocytes Absolute: 1 10*3/uL (ref 0.1–1.0)
Neutro Abs: 12.4 10*3/uL (ref 1.7–7.7)
Neutrophils Relative %: 72 %
PLATELETS: 397 10*3/uL (ref 150–400)
RBC: 3.95 MIL/uL (ref 3.87–5.11)
RDW: 13.5 % (ref 11.5–15.5)
WBC: 17.2 10*3/uL — ABNORMAL HIGH (ref 4.0–10.5)
nRBC: 0 % (ref 0.0–0.2)

## 2018-10-05 LAB — PROTIME-INR
INR: 1.23
Prothrombin Time: 15.4 seconds — ABNORMAL HIGH (ref 11.4–15.2)

## 2018-10-05 LAB — HEMOGLOBIN A1C
HEMOGLOBIN A1C: 5 % (ref 4.8–5.6)
Mean Plasma Glucose: 96.8 mg/dL

## 2018-10-05 LAB — APTT: aPTT: 32 seconds (ref 24–36)

## 2018-10-05 LAB — GC/CHLAMYDIA PROBE AMP (~~LOC~~) NOT AT ARMC
Chlamydia: NEGATIVE
Neisseria Gonorrhea: NEGATIVE

## 2018-10-05 MED ORDER — MIDAZOLAM HCL 2 MG/2ML IJ SOLN
INTRAMUSCULAR | Status: AC | PRN
  Administered 2018-10-05: 2 mg via INTRAVENOUS
  Administered 2018-10-05 (×4): 1 mg via INTRAVENOUS

## 2018-10-05 MED ORDER — LIDOCAINE HCL (PF) 1 % IJ SOLN
INTRAMUSCULAR | Status: AC | PRN
  Administered 2018-10-05: 10 mL

## 2018-10-05 MED ORDER — POTASSIUM CHLORIDE CRYS ER 20 MEQ PO TBCR
30.0000 meq | EXTENDED_RELEASE_TABLET | Freq: Two times a day (BID) | ORAL | Status: DC
  Administered 2018-10-05 – 2018-10-06 (×3): 30 meq via ORAL
  Filled 2018-10-05 (×3): qty 1

## 2018-10-05 MED ORDER — PROMETHAZINE HCL 25 MG/ML IJ SOLN
25.0000 mg | Freq: Once | INTRAMUSCULAR | Status: AC
  Administered 2018-10-05: 25 mg via INTRAVENOUS
  Filled 2018-10-05: qty 1

## 2018-10-05 MED ORDER — FENTANYL CITRATE (PF) 100 MCG/2ML IJ SOLN
INTRAMUSCULAR | Status: AC
  Filled 2018-10-05: qty 2

## 2018-10-05 MED ORDER — FENTANYL CITRATE (PF) 100 MCG/2ML IJ SOLN
INTRAMUSCULAR | Status: AC | PRN
  Administered 2018-10-05 (×4): 50 ug via INTRAVENOUS

## 2018-10-05 MED ORDER — ADULT MULTIVITAMIN W/MINERALS CH
1.0000 | ORAL_TABLET | Freq: Every day | ORAL | Status: DC
  Filled 2018-10-05: qty 1

## 2018-10-05 MED ORDER — MIDAZOLAM HCL 2 MG/2ML IJ SOLN
INTRAMUSCULAR | Status: AC
  Filled 2018-10-05: qty 4

## 2018-10-05 MED ORDER — ZOLPIDEM TARTRATE 5 MG PO TABS
5.0000 mg | ORAL_TABLET | Freq: Every evening | ORAL | Status: DC | PRN
  Administered 2018-10-05: 5 mg via ORAL
  Filled 2018-10-05: qty 1

## 2018-10-05 MED ORDER — POLYETHYLENE GLYCOL 3350 17 G PO PACK: 17.0000 g | PACK | Freq: Every day | ORAL | Status: DC

## 2018-10-05 MED ORDER — MIDAZOLAM HCL 2 MG/2ML IJ SOLN
INTRAMUSCULAR | Status: AC
  Filled 2018-10-05: qty 2

## 2018-10-05 NOTE — Progress Notes (Signed)
Care link here, patient [facility].

## 2018-10-05 NOTE — Progress Notes (Addendum)
Gynecology Progress Note  Admission Date: 10/03/2018 Current Date: 10/05/2018 9:36 AM  Jacqlyn Katrinka BlazingSmith is a 27 y.o. G0 (LMP: 11/23) HD#3 admitted for TOAs/pelvic abscess   History complicated by: Patient Active Problem List   Diagnosis Date Noted  . TOA (tubo-ovarian abscess) 10/03/2018  . Marijuana abuse 10/03/2018    ROS and patient/family/surgical history, located on admission H&P note dated 10/03/2018, have been reviewed, and there are no changes except as noted below Yesterday/Overnight Events:  38.6 last night at 2330. No additional work up done  Subjective:  Still with nausea w/o vomiting. S/s stable and not improving (abdominal pain, discomfort)  Objective:    Current Vital Signs 24h Vital Sign Ranges  T 98.8 F (37.1 C) Temp  Avg: 99.5 F (37.5 C)  Min: 98.1 F (36.7 C)  Max: 101.5 F (38.6 C)  BP 119/71 BP  Min: 119/71  Max: 147/87  HR 90 Pulse  Avg: 87.7  Min: 82  Max: 93  RR 16 Resp  Avg: 17.7  Min: 16  Max: 19  SaO2 95 % Room Air SpO2  Avg: 96.3 %  Min: 93 %  Max: 100 %       24 Hour I/O Current Shift I/O  Time Ins Outs 11/24 0701 - 11/25 0700 In: 826.4 [I.V.:526.3] Out: 100  No intake/output data recorded.   Patient Vitals for the past 24 hrs:  BP Temp Temp src Pulse Resp SpO2  10/05/18 0827 119/71 98.8 F (37.1 C) Oral 90 16 95 %  10/05/18 0303 132/74 99.9 F (37.7 C) Oral 90 19 93 %  10/04/18 2347 - 100 F (37.8 C) Oral - - -  10/04/18 2326 135/66 (!) 101.5 F (38.6 C) Oral 82 19 96 %  10/04/18 1924 127/65 99 F (37.2 C) Oral 86 18 94 %  10/04/18 1554 (!) 135/91 99.5 F (37.5 C) Oral 93 17 100 %  10/04/18 1139 (!) 147/87 98.1 F (36.7 C) Oral 85 17 100 %    Physical exam: General appearance: alert, cooperative and appears stated age Abdomen: soft, nd. No peritoneal s/s. Mildly ttp in lower abdomen GU: No gross VB Lungs: clear to auscultation bilaterally Heart: S1, S2 normal, no murmur, rub or gallop, regular rate and rhythm Extremities:  no c/c/e Skin: warm and dry Psych: appropriate Neurologic: Grossly normal  Medications Current Facility-Administered Medications  Medication Dose Route Frequency Provider Last Rate Last Dose  . acetaminophen (TYLENOL) tablet 650 mg  650 mg Oral Q6H PRN [place] BingPickens, Lariya Kinzie, MD      . cefOXitin (MEFOXIN) 2 g in sodium chloride 0.9 % 100 mL IVPB  2 g Intravenous Q6H [place] BingPickens, Dayln Tugwell, MD 200 mL/hr at 10/05/18 0519 2 g at 10/05/18 0519  . doxycycline (VIBRA-TABS) tablet 100 mg  100 mg Oral Q12H [place] BingPickens, Xitlally Mooneyham, MD   100 mg at 10/05/18 0854  . famotidine (PEPCID) tablet 20 mg  20 mg Oral BID [place] BingPickens, Bronsyn Shappell, MD   20 mg at 10/04/18 2311  . folic acid (FOLVITE) tablet 1 mg  1 mg Oral Daily [place] BingPickens, Carolos Fecher, MD   1 mg at 10/04/18 1130  . lactated ringers infusion   Intravenous Continuous Lazaro ArmsEure, Luther H, MD 100 mL/hr at 10/05/18 0904    . LORazepam (ATIVAN) tablet 1 mg  1 mg Oral Q6H PRN [place] BingPickens, Jaquise Faux, MD       Or  . LORazepam (ATIVAN) injection 1 mg  1 mg Intravenous Q6H PRN [place] BingPickens, Zavion Sleight, MD      . LORazepam (ATIVAN)  tablet 0-4 mg  0-4 mg Oral Q6H [place] Bing, MD       Followed by  . LORazepam (ATIVAN) tablet 0-4 mg  0-4 mg Oral Q12H [place] Bing, MD      . metroNIDAZOLE (FLAGYL) tablet 500 mg  500 mg Oral Q12H [place] Bing, MD   500 mg at 10/04/18 2311  . ondansetron (ZOFRAN) injection 4 mg  4 mg Intravenous Q8H PRN [place] Bing, MD   4 mg at 10/05/18 0900  . ondansetron (ZOFRAN-ODT) disintegrating tablet 4 mg  4 mg Oral Q8H PRN [place] Bing, MD      . oxyCODONE (Oxy IR/ROXICODONE) immediate release tablet 5-10 mg  5-10 mg Oral Q4H PRN [place] Bing, MD      . polyethylene glycol (MIRALAX / GLYCOLAX) packet 17 g  17 g Oral Daily [place] Bing, MD   17 g at 10/04/18 0919  . potassium chloride (K-DUR,KLOR-CON) CR tablet 30 mEq  30 mEq Oral BID [place] Bing, MD      . prenatal multivitamin tablet 1 tablet  1 tablet Oral Q1200 [place] Bing, MD   1 tablet at  10/04/18 1338  . prochlorperazine (COMPAZINE) injection 10 mg  10 mg Intravenous Q6H PRN [place] Bing, MD   10 mg at 10/04/18 1621  . prochlorperazine (COMPAZINE) tablet 10 mg  10 mg Oral Q6H PRN [place] Bing, MD   10 mg at 10/03/18 2145  . promethazine (PHENERGAN) injection 25 mg  25 mg Intravenous Once Lazaro Arms, MD      . senna (SENOKOT) tablet 8.6 mg  1 tablet Oral QHS PRN [place] Bing, MD      . simethicone (MYLICON) chewable tablet 80 mg  80 mg Oral QID PRN [place] Bing, MD      . thiamine (VITAMIN B-1) tablet 100 mg  100 mg Oral Daily [place] Bing, MD   100 mg at 10/04/18 0981   Or  . thiamine (B-1) injection 100 mg  100 mg Intravenous Daily [place] Bing, MD          Labs  Recent Labs  Lab 10/03/18 1111 10/04/18 0657 10/05/18 0521  WBC 19.5* 17.9* 17.2*  HGB 13.0 11.8* 11.9*  HCT 41.6 37.1 36.7  PLT 437* 362 397    Recent Labs  Lab 09/29/18 0830 10/01/18 0017 10/03/18 1145 10/04/18 0657  NA 138 142 138 137  K 4.6 3.4* 3.2*  4.6 3.1*  CL 108 106 106 106  CO2 19* 26 22 23   BUN 10 10 8 6   CREATININE 0.98 0.70 0.78 0.87  CALCIUM 8.9 8.0* 8.2* 8.0*  PROT 6.9  --  6.1*  --   BILITOT 1.4*  --  2.0*  --   ALKPHOS 78  --  62  --   ALT 18  --  11  --   AST 29  --  36  --   GLUCOSE 120* 121* 126* 104*   PTT: 32, INR 1.23, PT 15.4  Negative: HIV, rpr, UCx Pending: GC/CT  Radiology No new imaging  Assessment & Plan:  Pt stable *GYN: continue abx D#3. D/w IR and they believe they can drain and do it today *Pain: continue with PO meds *FEN/GI: NPO, MIVF, k dur written for. a1c added on to blood work *h/o substance abuse: no e/o etoh withdrawal. Can d/c ciwa *PPx: SCDs, OOB ad lib *Dispo: when feeling better and transitioned to PO meds  Code Status: Full Code  Total time taking care of the patient was 15  minutes, with greater than 50% of the time spent in face to face interaction with the patient.  Cornelia Copa  MD Attending Center for [facility])

## 2018-10-05 NOTE — Progress Notes (Signed)
Report called to Donzetta Sprungebbie Warren RN at [facility].

## 2018-10-05 NOTE — Progress Notes (Signed)
[facility] at 2pm. Per dispatcher, they will come to pick her up at 1-1:30

## 2018-10-05 NOTE — Procedures (Signed)
  Pre-operative Diagnosis: Pelvic abscess/ TOA      Post-operative Diagnosis: Complex pelvic collection    Indications: Pelvic abscess  Procedure: CT guided needle placement in pelvic collection.  Initially, only a few drops of blood could be aspirated.  Needle was repositioned and a small of amount of yellow tissue was aspirated after the needle was completely removed.  Findings: Needle position confirmed in the pelvic collection but no significant fluid collected.  ? Necrotic or very complex collection.  Small amount of blood and tissue sent for culture.  Complications: None     EBL: Minimal   Plan: Send material for culture.  [facility].

## 2018-10-05 NOTE — Consult Note (Signed)
Chief Complaint: Patient was seen in consultation today for CT guided aspiration/poosble drainage of pelvic/?tubo ovarian abscess  Referring Physician(s): Pickens,C  Supervising Physician: Richarda Overlie  Patient Status: [facility] IP  History of Present Illness: Jill Zamora is a 27 y.o. female with history of polysubstance abuse and persistent abdominal/pelvic/back discomfort, nausea/vomiting, leukocytosis and imaging findings concerning for tubo-ovarian abscess.  She is scheduled today for CT-guided drainage of the abscess.  Past Medical History:  Diagnosis Date  . Alcohol abuse with alcohol-induced mood disorder (HCC) 06/08/2017  . Anxiety   . Cocaine abuse (HCC) 06/08/2017  . Depression     Past Surgical History:  Procedure Laterality Date  . NO PAST SURGERIES      Allergies: Patient has no known allergies.  Medications: Prior to Admission medications   Medication Sig Start Date End Date Taking? Authorizing Provider  promethazine (PHENERGAN) 25 MG tablet Take 1 tablet (25 mg total) by mouth every 6 (six) hours as needed for nausea or vomiting. 10/01/18   Molpus, Jonny Ruiz, MD     History reviewed. No pertinent family history.  Social History   Socioeconomic History  . Marital status: Single    Spouse name: Not on file  . Number of children: Not on file  . Years of education: Not on file  . Highest education level: Not on file  Occupational History  . Not on file  Social Needs  . Financial resource strain: Not on file  . Food insecurity:    Worry: Not on file    Inability: Not on file  . Transportation needs:    Medical: Not on file    Non-medical: Not on file  Tobacco Use  . Smoking status: Current Every Day Smoker    Packs/day: 1.00    Years: 5.00    Pack years: 5.00    Types: Cigarettes  . Smokeless tobacco: Never Used  Substance and Sexual Activity  . Alcohol use: Yes    Comment: occasional   . Drug use: Yes    Types: Cocaine, Marijuana    Comment: last  cocaine use  August/ Marijuana Daily  . Sexual activity: Yes    Birth control/protection: None  Lifestyle  . Physical activity:    Days per week: Not on file    Minutes per session: Not on file  . Stress: Not on file  Relationships  . Social connections:    Talks on phone: Not on file    Gets together: Not on file    Attends religious service: Not on file    Active member of club or organization: Not on file    Attends meetings of clubs or organizations: Not on file    Relationship status: Not on file  Other Topics Concern  . Not on file  Social History Narrative  . Not on file     Review of Systems: see above;  she denies fever, headache, worsening dyspnea, cough; she has had some hematemesis recently and occasional reflux  Vital Signs: BP 129/79   Pulse 82   Temp 98.5 F (36.9 C) (Oral)   Resp 18   Ht 5\' 6"  (1.676 m)   Wt 250 lb (113.4 kg)   LMP 10/03/2018   SpO2 99%   BMI 40.35 kg/m   Physical Exam awake, alert.  Chest clear to auscultation bilaterally.  Heart with regular rate and rhythm.  Abdomen obese, soft, positive bowel sounds, tender mid to lower abdominal region; no significant lower extremity edema  Imaging: US  Transvaginal Non-ob  Result Date: 10/03/2018 CLINICAL DATA:  27 year old presenting with three-day history of generalized abdominal pain and hematemesis. CT earlier same day questioned a possible tubo-ovarian abscess. EXAM: TRANSABDOMINAL AND TRANSVAGINAL ULTRASOUND OF PELVIS DOPPLER ULTRASOUND OF OVARIES TECHNIQUE: Both transabdominal and transvaginal ultrasound examinations of the pelvis were performed. Transabdominal technique was performed for global imaging of the pelvis including uterus, ovaries, adnexal regions, and pelvic cul-de-sac. It was necessary to proceed with endovaginal exam following the transabdominal exam to visualize the ovaries and endometrium due to the incompletely distended bladder. Color and duplex Doppler ultrasound was utilized  to evaluate blood flow to the ovaries. COMPARISON:  Pelvic ultrasound 02/21/2011. CT abdomen and pelvis earlier same day and 01/11/2013. FINDINGS: Uterus Measurements: 7.1 x 3.6 x 4.8 cm = volume: 63 mL. Anteverted. Homogeneous echotexture without focal fibroid or other myometrial abnormality. Normal-appearing uterine cervix. Endometrium Thickness: Approximately 7 mm. Normal appearance without evidence of endometrial fluid or mass. Right ovary Measurements: 5.1 x 2.7 x 2.9 cm = volume: 21 mL. Small follicular cysts. No dominant cyst or solid mass. Normal color Doppler flow within the ovary. Left ovary Measurements: 4.2 x 2.5 x 1.6 cm = volume: 9 mL. Small follicular cysts. No dominant cyst or solid mass. Normal color Doppler flow within the ovary. Pulsed Doppler evaluation of both ovaries demonstrates normal low-resistance arterial and venous waveforms. Other findings Large heterogeneous collection which is contiguous with both ovaries and extends posterior to the uterus as noted on CT. There is no internal color Doppler flow within the collection, though there is adjacent hyperemia. IMPRESSION: 1. Findings which are felt to most likely represent a large abscess which is present in both adnexae and contiguous with both ovaries, extending posterior to the uterus. 2. Normal appearing uterus and endometrium. Electronically Signed   By: Hulan Saashomas  Lawrence M.D.   On: 10/03/2018 17:08   [place] Pelvis Complete  Result Date: 10/03/2018 CLINICAL DATA:  27 year old presenting with three-day history of generalized abdominal pain and hematemesis. CT earlier same day questioned a possible tubo-ovarian abscess. EXAM: TRANSABDOMINAL AND TRANSVAGINAL ULTRASOUND OF PELVIS DOPPLER ULTRASOUND OF OVARIES TECHNIQUE: Both transabdominal and transvaginal ultrasound examinations of the pelvis were performed. Transabdominal technique was performed for global imaging of the pelvis including uterus, ovaries, adnexal regions, and pelvic  cul-de-sac. It was necessary to proceed with endovaginal exam following the transabdominal exam to visualize the ovaries and endometrium due to the incompletely distended bladder. Color and duplex Doppler ultrasound was utilized to evaluate blood flow to the ovaries. COMPARISON:  Pelvic ultrasound 02/21/2011. CT abdomen and pelvis earlier same day and 01/11/2013. FINDINGS: Uterus Measurements: 7.1 x 3.6 x 4.8 cm = volume: 63 mL. Anteverted. Homogeneous echotexture without focal fibroid or other myometrial abnormality. Normal-appearing uterine cervix. Endometrium Thickness: Approximately 7 mm. Normal appearance without evidence of endometrial fluid or mass. Right ovary Measurements: 5.1 x 2.7 x 2.9 cm = volume: 21 mL. Small follicular cysts. No dominant cyst or solid mass. Normal color Doppler flow within the ovary. Left ovary Measurements: 4.2 x 2.5 x 1.6 cm = volume: 9 mL. Small follicular cysts. No dominant cyst or solid mass. Normal color Doppler flow within the ovary. Pulsed Doppler evaluation of both ovaries demonstrates normal low-resistance arterial and venous waveforms. Other findings Large heterogeneous collection which is contiguous with both ovaries and extends posterior to the uterus as noted on CT. There is no internal color Doppler flow within the collection, though there is adjacent hyperemia. IMPRESSION: 1. Findings which are felt to most  likely represent a large abscess which is present in both adnexae and contiguous with both ovaries, extending posterior to the uterus. 2. Normal appearing uterus and endometrium. Electronically Signed   By: Hulan Saas M.D.   On: 10/03/2018 17:08   Ct Abdomen Pelvis W Contrast  Result Date: 10/03/2018 CLINICAL DATA:  Hematemesis and abdominal pain for 3 days, dark red and clumpy vomitus, history of alcohol abuse, smoking EXAM: CT ABDOMEN AND PELVIS WITH CONTRAST TECHNIQUE: Multidetector CT imaging of the abdomen and pelvis was performed using the standard  protocol following bolus administration of intravenous contrast. Sagittal and coronal MPR images reconstructed from axial data set. CONTRAST:  OMNIPAQUE IOHEXOL 300 MG/ML SOLN IV. No oral contrast. COMPARISON:  01/11/2013 FINDINGS: Lower chest: Subsegmental atelectasis at LEFT lung base Hepatobiliary: Gallbladder and liver normal appearance Pancreas: Normal appearance Spleen: Normal appearance Adrenals/Urinary Tract: Adrenal glands, kidneys, ureters, and bladder normal appearance Stomach/Bowel: Normal appendix. Wall thickening of distal sigmoid colon, likely reactive, see below. Stomach and bowel loops otherwise normal appearance. Vascular/Lymphatic: Vascular structures patent. Reproductive: Uterus normal appearance. Multiloculated inflammatory appearing fluid collection in the adnexa and cul-de-sac most consistent with a tubo-ovarian abscess. Overall area measures approximately 9.6 x 7.3 x 6.2 cm. Mild surrounding edema/ill definition of tissue planes. Underlying mass lesion not completely excluded. Other: No free air.  No significant ascites.  No hernia. Musculoskeletal: Osseous structures unremarkable. IMPRESSION: Multiloculated fluid collection in pelvis with surrounding infiltrative changes, appears inflammatory, favor tubo-ovarian abscess. Followup until resolution recommended to exclude underlying mass lesion of the ovaries. Likely reactive bowel wall thickening of the adjacent sigmoid colon. Electronically Signed   By: Ulyses Southward M.D.   On: 10/03/2018 14:21   [place] Art/ven Flow Abd Pelv Doppler  Result Date: 10/03/2018 CLINICAL DATA:  27 year old presenting with three-day history of generalized abdominal pain and hematemesis. CT earlier same day questioned a possible tubo-ovarian abscess. EXAM: TRANSABDOMINAL AND TRANSVAGINAL ULTRASOUND OF PELVIS DOPPLER ULTRASOUND OF OVARIES TECHNIQUE: Both transabdominal and transvaginal ultrasound examinations of the pelvis were performed. Transabdominal  technique was performed for global imaging of the pelvis including uterus, ovaries, adnexal regions, and pelvic cul-de-sac. It was necessary to proceed with endovaginal exam following the transabdominal exam to visualize the ovaries and endometrium due to the incompletely distended bladder. Color and duplex Doppler ultrasound was utilized to evaluate blood flow to the ovaries. COMPARISON:  Pelvic ultrasound 02/21/2011. CT abdomen and pelvis earlier same day and 01/11/2013. FINDINGS: Uterus Measurements: 7.1 x 3.6 x 4.8 cm = volume: 63 mL. Anteverted. Homogeneous echotexture without focal fibroid or other myometrial abnormality. Normal-appearing uterine cervix. Endometrium Thickness: Approximately 7 mm. Normal appearance without evidence of endometrial fluid or mass. Right ovary Measurements: 5.1 x 2.7 x 2.9 cm = volume: 21 mL. Small follicular cysts. No dominant cyst or solid mass. Normal color Doppler flow within the ovary. Left ovary Measurements: 4.2 x 2.5 x 1.6 cm = volume: 9 mL. Small follicular cysts. No dominant cyst or solid mass. Normal color Doppler flow within the ovary. Pulsed Doppler evaluation of both ovaries demonstrates normal low-resistance arterial and venous waveforms. Other findings Large heterogeneous collection which is contiguous with both ovaries and extends posterior to the uterus as noted on CT. There is no internal color Doppler flow within the collection, though there is adjacent hyperemia. IMPRESSION: 1. Findings which are felt to most likely represent a large abscess which is present in both adnexae and contiguous with both ovaries, extending posterior to the uterus. 2. Normal appearing uterus and  endometrium. Electronically Signed   By: Hulan Saas M.D.   On: 10/03/2018 17:08   Dg Abd Acute W/chest  Result Date: 10/03/2018 CLINICAL DATA:  Acute abdominal pain for 3 days. EXAM: DG ABDOMEN ACUTE W/ 1V CHEST COMPARISON:  06/17/2018, 06/15/2018 and prior radiographs FINDINGS:  Cardiomediastinal silhouette is unremarkable. Mild peribronchial thickening noted. No pleural effusion, airspace disease or pneumothorax. Nondistended gas-filled loops of small bowel within the RIGHT abdomen noted. No dilated bowel loops, bowel obstruction or pneumoperitoneum identified. No suspicious calcifications are present. IMPRESSION: Nonspecific nonobstructive bowel gas pattern. No evidence of pneumoperitoneum. No evidence of acute cardiopulmonary disease. Electronically Signed   By: Harmon Pier M.D.   On: 10/03/2018 12:26    Labs:  CBC: Recent Labs    10/01/18 0017 10/03/18 1111 10/04/18 0657 10/05/18 0521  WBC 18.1* 19.5* 17.9* 17.2*  HGB 12.3 13.0 11.8* 11.9*  HCT 38.9 41.6 37.1 36.7  PLT 389 437* 362 397    COAGS: Recent Labs    10/05/18 0521  INR 1.23  APTT 32    BMP: Recent Labs    09/29/18 0830 10/01/18 0017 10/03/18 1145 10/04/18 0657  NA 138 142 138 137  K 4.6 3.4* 3.2*  4.6 3.1*  CL 108 106 106 106  CO2 19* 26 22 23   GLUCOSE 120* 121* 126* 104*  BUN 10 10 8 6   CALCIUM 8.9 8.0* 8.2* 8.0*  CREATININE 0.98 0.70 0.78 0.87  GFRNONAA >60 >60 >60 >60  GFRAA >60 >60 >60 >60    LIVER FUNCTION TESTS: Recent Labs    06/15/18 2327 06/17/18 0356 08/10/18 1941 09/29/18 0830 10/03/18 1145  BILITOT 0.6  --  0.4 1.4* 2.0*  AST 78*  --  19 29 36  ALT 54*  --  15 18 11   ALKPHOS 57  --  57 78 62  PROT 6.6  --  5.6* 6.9 6.1*  ALBUMIN 3.7 2.6* 3.2* 3.2* 2.7*    TUMOR MARKERS: No results for input(s): AFPTM, CEA, CA199, CHROMGRNA in the last 8760 hours.  Assessment and Plan: 27 y.o. female with history of polysubstance abuse and persistent abdominal/pelvic/back discomfort, nausea/vomiting, leukocytosis and imaging findings concerning for tubo-ovarian abscess.  She is scheduled today for CT-guided drainage of the abscess.  Imaging studies have been reviewed by Dr. Lowella Dandy. Risks and benefits discussed with the patient including bleeding, infection, damage to  adjacent structures, bowel perforation/fistula connection, and sepsis.  All of the patient's questions were answered, patient is agreeable to proceed. Consent signed and in chart.    Thank you for this interesting consult.  I greatly enjoyed meeting [place] and look forward to participating in their care.  A copy of this report was sent to the requesting provider on this date.  Electronically Signed: D. Jeananne Rama, PA-C 10/05/2018, 1:54 PM   I spent a total of 25 minutes  in face to face in clinical consultation, greater than 50% of which was counseling/coordinating care for CT-guided aspiration/possible drainage of pelvic abscess

## 2018-10-05 NOTE — Progress Notes (Signed)
[facility] here [facility]

## 2018-10-06 LAB — CBC WITH DIFFERENTIAL/PLATELET
BASOS ABS: 0 10*3/uL (ref 0.0–0.1)
Basophils Relative: 0 %
EOS ABS: 0.2 10*3/uL (ref 0.0–0.5)
EOS PCT: 1 %
HCT: 38.1 % (ref 36.0–46.0)
HEMOGLOBIN: 12.5 g/dL (ref 12.0–15.0)
LYMPHS ABS: 2.8 10*3/uL (ref 0.7–4.0)
LYMPHS PCT: 16 %
MCH: 30.6 pg (ref 26.0–34.0)
MCHC: 32.8 g/dL (ref 30.0–36.0)
MCV: 93.4 fL (ref 80.0–100.0)
MONOS PCT: 9 %
Monocytes Absolute: 1.5 10*3/uL — ABNORMAL HIGH (ref 0.1–1.0)
NEUTROS PCT: 74 %
NRBC: 0 % (ref 0.0–0.2)
Neutro Abs: 12.9 10*3/uL — ABNORMAL HIGH (ref 1.7–7.7)
Platelets: 374 10*3/uL (ref 150–400)
RBC: 4.08 MIL/uL (ref 3.87–5.11)
RDW: 13.7 % (ref 11.5–15.5)
WBC: 17.4 10*3/uL — ABNORMAL HIGH (ref 4.0–10.5)

## 2018-10-06 MED ORDER — DOXYCYCLINE HYCLATE 100 MG PO TABS: 100.0000 mg | ORAL_TABLET | Freq: Two times a day (BID) | ORAL | 0 refills | Status: AC

## 2018-10-06 MED ORDER — OXYCODONE-ACETAMINOPHEN 5-325 MG PO TABS: 1.0000 | ORAL_TABLET | Freq: Four times a day (QID) | ORAL | 0 refills | Status: DC | PRN

## 2018-10-06 MED ORDER — ADULT MULTIVITAMIN W/MINERALS CH: 1.0000 | ORAL_TABLET | Freq: Every day | ORAL | Status: DC

## 2018-10-06 MED ORDER — ACETAMINOPHEN 325 MG PO TABS: 650.0000 mg | ORAL_TABLET | Freq: Four times a day (QID) | ORAL | Status: DC | PRN

## 2018-10-06 MED ORDER — POLYETHYLENE GLYCOL 3350 17 G PO PACK: 17.0000 g | PACK | Freq: Every day | ORAL | 0 refills | Status: DC

## 2018-10-06 MED ORDER — PROMETHAZINE HCL 25 MG PO TABS: 25.0000 mg | ORAL_TABLET | Freq: Four times a day (QID) | ORAL | 0 refills | Status: DC | PRN

## 2018-10-06 MED ORDER — METRONIDAZOLE 500 MG PO TABS: 500.0000 mg | ORAL_TABLET | Freq: Two times a day (BID) | ORAL | 0 refills | Status: AC

## 2018-10-06 MED ORDER — POTASSIUM CHLORIDE CRYS ER 15 MEQ PO TBCR: 30.0000 meq | EXTENDED_RELEASE_TABLET | Freq: Two times a day (BID) | ORAL | 0 refills | Status: DC

## 2018-10-06 NOTE — Discharge Instructions (Signed)
Pelvic Inflammatory Disease °Pelvic inflammatory disease (PID) is an infection in some or all of the female organs. PID can be in the uterus, ovaries, fallopian tubes, or the surrounding tissues that are inside the lower belly area (pelvis). PID can lead to lasting problems if it is not treated. To check for this disease, your doctor may: °· Do a physical exam. °· Do blood tests, urine tests, or a pregnancy test. °· Look at your vaginal discharge. °· Do tests to look inside the pelvis. °· Test you for other infections. ° °Follow these instructions at home: °· Take over-the-counter and prescription medicines only as told by your doctor. °· If you were prescribed an antibiotic medicine, take it as told by your doctor. Do not stop taking it even if you start to feel better. °· Do not have sex until treatment is done or as told by your doctor. °· Tell your sex partner if you have PID. Your partner may need to be treated. °· Keep all follow-up visits as told by your doctor. This is important. °· Your doctor may test you for infection again 3 months after you are treated. °Contact a doctor if: °· You have more fluid (discharge) coming from your vagina or fluid that is not normal. °· Your pain does not improve. °· You throw up (vomit). °· You have a fever. °· You cannot take your medicines. °· Your partner has a sexually transmitted disease (STD). °· You have pain when you pee (urinate). °Get help right away if: °· You have more belly (abdominal) or lower belly pain. °· You have chills. °· You are not better after 72 hours. °This information is not intended to replace advice given to you by your health care provider. Make sure you discuss any questions you have with your health care provider. °Document Released: 01/24/2009 Document Revised: 04/04/2016 Document Reviewed: 12/05/2014 °Elsevier Interactive Patient Education © 2018 [employer] ° °

## 2018-10-06 NOTE — Care Management (Signed)
Received call from RNElita Quick- Pam requesting CM see patient regarding discharge medications.  Patient does not have insurance.  [facility] pharmacies for each med to be $3.00 each (excluding) the narcotic. Patient given CM phone number and phone number of participating pharmacies.  Patient verbalized understanding and stated she can pay $3.00 each for each of her prescriptions. No other needs noted.

## 2018-10-06 NOTE — Plan of Care (Signed)
Patient being discharged after her 11am dose of Mefoxin.

## 2018-10-06 NOTE — Discharge Summary (Signed)
Discharge Summary   Admit Date: 10/03/2018 Discharge Date: 10/06/2018 Discharging Service: Gynecology  Primary OBGYN: None Admitting Physician: [place] Bing, MD  Discharge Physician: Vergie Living  Referring Provider: [facility]  Primary Care Provider: Patient, No Pcp Per  Admission Diagnoses: *Likely TOA  Discharge Diagnoses: *Same  Consult Orders: IP CONSULT TO OB GYN  Interventional Radiology  Surgeries/Procedures Performed: 10/05/18: attempted IR drainage of pelvic abscesses  History and Physical: Obstetrics & Gynecology H&P  Date of Admission: 10/03/2018  Requesting Provider: [facility]  Primary OBGYN: None  Primary Care Provider: Patient, No Pcp Per  Reason for Admission: likely TOAs  History of Present Illness: Jill Zamora is a 27 y.o. G0 (LMP: 11/23.19), with the above CC. PMHx is significant for BMI 40s, THC use, tobacco abuse, h/o polysubstance abuse.  Patient went to [facility] ED [facility] 11/19 and [facility] ED on 11/21 for similar complaints of n/v and she states hematemesis and re-presented to the ED at [facility] with continued s/s. First imaging was done today and concerning for likely TOAs. Patient started on abx and transferred here.  No current hematemesis and no prior history. Still with mild nausea. No fevers, chills, chest pain  ROS: A 12-point review of systems was performed and negative, except as stated in the above HPI.  OBGYN History: As per HPI.         OB History  Gravida Para Term Preterm AB Living  0 0 0 0 0 0  SAB TAB Ectopic Multiple Live Births   0 0 0 0 0    Periods: qmonth, regular, approx 1wk  Past Medical History:      Past Medical History:  Diagnosis Date  . Alcohol abuse with alcohol-induced mood disorder (HCC) 06/08/2017  . Anxiety   . Cocaine abuse (HCC) 06/08/2017  . Depression    Past Surgical History:       Past Surgical History:  Procedure Laterality Date  . NO PAST SURGERIES     Family History:  Not reviewed  Social History:  Social  History        Socioeconomic History  . Marital status: Single    Spouse name: Not on file  . Number of children: Not on file  . Years of education: Not on file  . Highest education level: Not on file  Occupational History  . Not on file  Social Needs  . Financial resource strain: Not on file  . Food insecurity:    Worry: Not on file    Inability: Not on file  . Transportation needs:    Medical: Not on file    Non-medical: Not on file  Tobacco Use  . Smoking status: Current Every Day Smoker    Packs/day: 1.00    Years: 5.00    Pack years: 5.00    Types: Cigarettes  . Smokeless tobacco: Never Used  Substance and Sexual Activity  . Alcohol use: Yes    Comment: occasional   . Drug use: Yes    Types: Cocaine, Marijuana    Comment: last cocaine use August/ Marijuana Daily  . Sexual activity: Yes    Birth control/protection: None  Lifestyle  . Physical activity:    Days per week: Not on file    Minutes per session: Not on file  . Stress: Not on file  Relationships  . Social connections:    Talks on phone: Not on file    Gets together: Not on file    Attends religious service: Not on  file    Active member of club or organization: Not on file    Attends meetings of clubs or organizations: Not on file    Relationship status: Not on file  . Intimate partner violence:    Fear of current or ex partner: Not on file    Emotionally abused: Not on file    Physically abused: Not on file    Forced sexual activity: Not on file  Other Topics Concern  . Not on file  Social History Narrative  . Not on file   Allergy:  No Known Allergies  Current Outpatient Medications:         Medications Prior to Admission  Medication Sig Dispense Refill Last Dose  . promethazine (PHENERGAN) 25 MG tablet Take 1 tablet (25 mg total) by mouth every 6 (six) hours as needed for nausea or vomiting. 12 tablet 0    Hospital Medications:           Current Facility-Administered Medications   Medication Dose Route Frequency Provider Last Rate Last Dose  . acetaminophen (TYLENOL) tablet 650 mg 650 mg Oral Q6H PRN [place] Bing, MD    . cefOXitin (MEFOXIN) 2 g in sodium chloride 0.9 % 100 mL IVPB 2 g Intravenous Q6H [place] Bing, MD    . Melene Muller ON 10/04/2018] doxycycline (VIBRA-TABS) tablet 100 mg 100 mg Oral Q12H [place] Bing, MD    . famotidine (PEPCID) tablet 20 mg 20 mg Oral BID [place] Bing, MD  20 mg at 10/03/18 2145  . folic acid (FOLVITE) tablet 1 mg 1 mg Oral Daily [place] Bing, MD    . lactated ringers infusion  Intravenous Continuous [place] Bing, MD 100 mL/hr at 10/03/18 2149   . LORazepam (ATIVAN) tablet 1 mg 1 mg Oral Q6H PRN [place] Bing, MD     Or  . LORazepam (ATIVAN) injection 1 mg 1 mg Intravenous Q6H PRN [place] Bing, MD    . LORazepam (ATIVAN) tablet 0-4 mg 0-4 mg Oral Q6H [place] Bing, MD     Followed by  . [START ON 10/05/2018] LORazepam (ATIVAN) tablet 0-4 mg 0-4 mg Oral Q12H [place] Bing, MD    . metroNIDAZOLE (FLAGYL) tablet 500 mg 500 mg Oral Q12H [place] Bing, MD  500 mg at 10/03/18 2145  . [START ON 10/04/2018] multivitamin with minerals tablet 1 tablet 1 tablet Oral Daily [place] Bing, MD    . oxyCODONE (Oxy IR/ROXICODONE) immediate release tablet 5-10 mg 5-10 mg Oral Q4H PRN [place] Bing, MD    . Melene Muller ON 10/04/2018] polyethylene glycol (MIRALAX / GLYCOLAX) packet 17 g 17 g Oral Daily [place] Bing, MD    . Melene Muller ON 10/04/2018] prenatal multivitamin tablet 1 tablet 1 tablet Oral Q1200 [place] Bing, MD    . prochlorperazine (COMPAZINE) injection 10 mg 10 mg Intravenous Q6H PRN [place] Bing, MD    . prochlorperazine (COMPAZINE) tablet 10 mg 10 mg Oral Q6H PRN [place] Bing, MD  10 mg at 10/03/18 2145  . senna (SENOKOT) tablet 8.6 mg 1 tablet Oral QHS PRN [place] Bing, MD    . simethicone (MYLICON) chewable tablet 80 mg 80 mg Oral QID PRN [place] Bing, MD    . Melene Muller ON 10/04/2018]  thiamine (VITAMIN B-1) tablet 100 mg 100 mg Oral Daily [place] Bing, MD     Or  . Melene Muller ON 10/04/2018] thiamine (B-1) injection 100 mg 100 mg Intravenous Daily  Bing, MD     Physical Exam:   Current Vital Signs 24h Vital Sign Ranges  T (!) 100.4 F (38 C) Temp Avg: 99.2 F (37.3 C) Min: 98 F (36.7 C) Max: 100.4 F (38 C)  BP 106/72 BP Min: 106/72 Max: 138/87  HR 72 Pulse Avg: 74 Min: 58 Max: 81  RR 19 Resp Avg: 18.1 Min: 13 Max: 24  SaO2 94 % Room Air SpO2 Avg: 96.1 % Min: 84 % Max: 100 %       24 Hour I/O Current Shift I/O  Time  Ins  Outs No intake/output data recorded. 11/23 1901 - 11/24 0700  In: 1100  Out: -   Patient Vitals for the past 24 hrs:   BP Temp Temp src Pulse Resp SpO2 Weight  10/03/18 2012 106/72 (!) 100.4 F (38 C) Oral 72 19 94 % -  10/03/18 1930 115/65 - - 78 - 94 % -  10/03/18 1830 126/77 - - 74 - 99 % -  10/03/18 1745 108/70 - - 80 - 97 % -  10/03/18 1730 123/75 - - 72 - 96 % -  10/03/18 1715 124/75 - - 81 15 94 % -  10/03/18 1615 138/87 - - 76 (!) 24 98 % -  10/03/18 1330 - - - 64 17 99 % -  10/03/18 1136 129/73 98 F (36.7 C) Oral (!) 58 15 100 % -  10/03/18 1130 129/73 - - 80 (!) 21 (!) 84 % -  10/03/18 1115 129/70 - - 79 (!) 21 100 % -  10/03/18 1102 - - - - - - 113.4 kg  10/03/18 1100 116/73 - - - 13 - -  10/03/18 1058 - - - - - 98 % -   Body mass index is 40.35 kg/m.  General appearance: Well nourished, well developed female. tired  Cardiovascular: S1, S2 normal, no murmur, rub or gallop, regular rate and rhythm  Respiratory: Clear to auscultation bilateral. Normal respiratory effort  Abdomen: positive bowel sounds and no masses, hernias; diffusely non tender to palpation, non distended  Neuro/Psych: Normal mood and affect.  Skin: Warm and dry.  Extremities: no clubbing, cyanosis, or edema.  Laboratory:  Pending: GC/CT, HIV, rpr, UCx  UDS: thc and opiates (likely from ED)  Beta HCG, wet prep: negative       Recent  Labs  Lab 09/29/18  0830 10/01/18  0017 10/03/18  1111  WBC 22.8* 18.1* 19.5*  HGB 13.9 12.3 13.0  HCT 42.9 38.9 41.6  PLT 375 389 437*        Recent Labs  Lab 09/29/18  0830 10/01/18  0017 10/03/18  1145  NA 138 142 138  K 4.6 3.4* 3.2*  4.6  CL 108 106 106  CO2 19* 26 22  BUN 10 10 8   CREATININE 0.98 0.70 0.78  CALCIUM 8.9 8.0* 8.2*  PROT 6.9 --  6.1*  BILITOT 1.4* --  2.0*  ALKPHOS 78 --  62  ALT 18 --  11  AST 29 --  36  GLUCOSE 120* 121* 126*   No results for input(s): APTT, INR, PTT in the last 168 hours.  Invalid input(s): DRHAPTT     Recent Labs  Lab 10/03/18  1105  ABORH O POS  Performed at [facility], [address], [place], [place] [address]   O POS   Imaging:  CLINICAL DATA: 27 year old presenting with three-day history of  generalized abdominal pain and hematemesis. CT earlier same day  questioned a possible tubo-ovarian abscess.  EXAM:  TRANSABDOMINAL AND TRANSVAGINAL ULTRASOUND OF PELVIS  DOPPLER ULTRASOUND OF OVARIES  TECHNIQUE:  Both transabdominal and transvaginal ultrasound examinations of the  pelvis were performed. Transabdominal technique was performed for  global imaging of the pelvis including uterus, ovaries, adnexal  regions, and pelvic cul-de-sac.  It was necessary to proceed with endovaginal exam following the  transabdominal exam to visualize the ovaries and endometrium due to  the incompletely distended bladder. Color and duplex Doppler  ultrasound was utilized to evaluate blood flow to the ovaries.  COMPARISON: Pelvic ultrasound 02/21/2011. CT abdomen and pelvis  earlier same day and 01/11/2013.  FINDINGS:  Uterus  Measurements: 7.1 x 3.6 x 4.8 cm = volume: 63 mL. Anteverted.  Homogeneous echotexture without focal fibroid or other myometrial  abnormality. Normal-appearing uterine cervix.  Endometrium  Thickness: Approximately 7 mm. Normal appearance without evidence of  endometrial fluid or mass.  Right ovary   Measurements: 5.1 x 2.7 x 2.9 cm = volume: 21 mL. Small follicular  cysts. No dominant cyst or solid mass. Normal color Doppler flow  within the ovary.  Left ovary  Measurements: 4.2 x 2.5 x 1.6 cm = volume: 9 mL. Small follicular  cysts. No dominant cyst or solid mass. Normal color Doppler flow  within the ovary.  Pulsed Doppler evaluation of both ovaries demonstrates normal  low-resistance arterial and venous waveforms.  Other findings  Large heterogeneous collection which is contiguous with both ovaries  and extends posterior to the uterus as noted on CT. There is no  internal color Doppler flow within the collection, though there is  adjacent hyperemia.  IMPRESSION:  1. Findings which are felt to most likely represent a large abscess  which is present in both adnexae and contiguous with both ovaries,  extending posterior to the uterus.  2. Normal appearing uterus and endometrium.  Electronically Signed  By: Hulan Saashomas Lawrence M.D.  On: 10/03/2018 17:08  CLINICAL DATA: Hematemesis and abdominal pain for 3 days, dark red  and clumpy vomitus, history of alcohol abuse, smoking  EXAM:  CT ABDOMEN AND PELVIS WITH CONTRAST  TECHNIQUE:  Multidetector CT imaging of the abdomen and pelvis was performed  using the standard protocol following bolus administration of  intravenous contrast. Sagittal and coronal MPR images reconstructed  from axial data set.  CONTRAST: 100mL OMNIPAQUE IOHEXOL 300 MG/ML SOLN IV. No oral  contrast.  COMPARISON: 01/11/2013  FINDINGS:  Lower chest: Subsegmental atelectasis at LEFT lung base  Hepatobiliary: Gallbladder and liver normal appearance  Pancreas: Normal appearance  Spleen: Normal appearance  Adrenals/Urinary Tract: Adrenal glands, kidneys, ureters, and  bladder normal appearance  Stomach/Bowel: Normal appendix. Wall thickening of distal sigmoid  colon, likely reactive, see below. Stomach and bowel loops otherwise  normal appearance.   Vascular/Lymphatic: Vascular structures patent.  Reproductive: Uterus normal appearance. Multiloculated inflammatory  appearing fluid collection in the adnexa and cul-de-sac most  consistent with a tubo-ovarian abscess. Overall area measures  approximately 9.6 x 7.3 x 6.2 cm. Mild surrounding edema/ill  definition of tissue planes. Underlying mass lesion not completely  excluded.  Other: No free air. No significant ascites. No hernia.  Musculoskeletal: Osseous structures unremarkable.  IMPRESSION:  Multiloculated fluid collection in pelvis with surrounding  infiltrative changes, appears inflammatory, favor tubo-ovarian  abscess.  Followup until resolution recommended to exclude underlying mass  lesion of the ovaries.  Likely reactive bowel wall thickening of the adjacent sigmoid colon.  Electronically Signed  By: Ulyses SouthwardMark Boles M.D.  On: 10/03/2018 14:21  Assessment: Jill Zamora is a 27 y.o. G0 likely with s/s  related to TOAs. Pt stable  Plan:  Admit to GYN. Continue IV/PO abx. It appears patient had temp on arrival will. Will recheck and if febrile still will get BCx x 2. Diet as tolerated. PPI, SCDs. Rpt labs in AM. Avoid NSAIDs for now  Patient states no h/o recent etoh abuse. Will do ciwa protocol  Total time taking care of the patient was 30 minutes, with greater than 50% of the time spent in face to face interaction with the patient.  Cornelia Copa MD  Attending  Center for [facility] Course: *GYN: Patient was continued on doxycycline, flagyl and cefoxitin during her stay. She had an IR attempt at drainage on 11/25 but was unsuccessful because they were able to have any fluid draw out with needle placement. Patient last had a slight temperature to 38.3 on 11/25 at 1830 but was back to normal an hour later. On the day of discharge, she states she was eating w/o difficulty, no nausea, pain was much improved and she strongly desired discharge to  home; she also looked more awake, alert and well appearing. I recommended she stay overnight just to make sure she continued to improved but she strongly desired discharge to home on 11/26.  I gave her strict ER precautions and to have case management help her with medications and with plans to see her back next week.  CBC Latest Ref Rng & Units 10/06/2018 10/05/2018 10/04/2018  WBC 4.0 - 10.5 K/uL 17.4(H) 17.2(H) 17.9(H)  Hemoglobin 12.0 - 15.0 g/dL 11.9 11.9(L) 11.8(L)  Hematocrit 36.0 - 46.0 % 38.1 36.7 37.1  Platelets 150 - 400 K/uL 374 397 362   *Pain: she rarely needed any PO pain meds *FEN/GI: a1c normal. Pt given Kdur *h/o substance abuse: no e/o etoh withdrawal with CIWA *PPx: SCDs, OOB ad lib  Discharge Exam:     Current Vital Signs 24h Vital Sign Ranges  T 98.6 F (37 C) Temp  Avg: 99.1 F (37.3 C)  Min: 98.5 F (36.9 C)  Max: 100.9 F (38.3 C)  BP (!) 121/92 BP  Min: 109/54  Max: 130/59  HR 87 Pulse  Avg: 87.3  Min: 82  Max: 95  RR 18 Resp  Avg: 17.6  Min: 15  Max: 19  SaO2 98 % Room Air SpO2  Avg: 97.9 %  Min: 95 %  Max: 100 %       24 Hour I/O Current Shift I/O  Time Ins Outs No intake/output data recorded. No intake/output data recorded.   General appearance: Well nourished, well developed female in no acute distress.  Neck:  Supple, normal appearance, and no thyromegaly  Cardiovascular: S1, S2 normal, no murmur, rub or gallop, regular rate and rhythm Respiratory:  Clear to auscultation bilateral. Normal respiratory effort Abdomen: positive bowel sounds and no masses, hernias; diffusely non tender to palpation, non distended Neuro/Psych:  Normal mood and affect.  Skin:  Warm and dry.   Discharge Disposition:  Home  Patient Instructions:  Standard   Results Pending at Discharge:  none  Discharge Medications: Allergies as of 10/06/2018   No Known Allergies     Medication List    TAKE these medications   acetaminophen 325 MG tablet Commonly known  as:  TYLENOL Take 2 tablets (650 mg total) by mouth every 6 (six) hours as needed for mild pain, moderate pain or fever.   doxycycline 100 MG tablet Commonly known as:  VIBRA-TABS Take 1 tablet (100 mg  total) by mouth 2 (two) times daily for 12 days.   metroNIDAZOLE 500 MG tablet Commonly known as:  FLAGYL Take 1 tablet (500 mg total) by mouth 2 (two) times daily for 12 days.   multivitamin with minerals Tabs tablet Take 1 tablet by mouth daily.   oxyCODONE-acetaminophen 5-325 MG tablet Commonly known as:  PERCOCET/ROXICET Take 1-2 tablets by mouth every 6 (six) hours as needed.   polyethylene glycol packet Commonly known as:  MIRALAX / GLYCOLAX Take 17 g by mouth daily.   potassium chloride SA 15 MEQ tablet Commonly known as:  KLOR-CON M15 Take 2 tablets (30 mEq total) by mouth 2 (two) times daily for 3 doses.   promethazine 25 MG tablet Commonly known as:  PHENERGAN Take 1 tablet (25 mg total) by mouth every 6 (six) hours as needed for nausea or vomiting.        No future appointments.  Request sent to clinic for GYN follow up for next week.   Cornelia Copa MD Attending Center for [facility])

## 2018-10-15 ENCOUNTER — Encounter: Payer: Self-pay | Admitting: Obstetrics and Gynecology

## 2019-01-01 ENCOUNTER — Encounter (HOSPITAL_COMMUNITY): Payer: Self-pay | Admitting: Emergency Medicine

## 2019-01-01 ENCOUNTER — Emergency Department (HOSPITAL_COMMUNITY)
Admission: EM | Admit: 2019-01-01 | Discharge: 2019-01-01 | Disposition: A | Discharge status: Home / Self Care | Payer: Self-pay | Attending: Emergency Medicine | Admitting: Emergency Medicine

## 2019-01-01 ENCOUNTER — Other Ambulatory Visit: Payer: Self-pay

## 2019-01-01 DIAGNOSIS — Z79899 Other long term (current) drug therapy: Secondary | ICD-10-CM | POA: Insufficient documentation

## 2019-01-01 DIAGNOSIS — J069 Acute upper respiratory infection, unspecified: Secondary | ICD-10-CM | POA: Insufficient documentation

## 2019-01-01 DIAGNOSIS — B9789 Other viral agents as the cause of diseases classified elsewhere: Secondary | ICD-10-CM

## 2019-01-01 DIAGNOSIS — F17210 Nicotine dependence, cigarettes, uncomplicated: Secondary | ICD-10-CM | POA: Insufficient documentation

## 2019-01-01 NOTE — ED Notes (Signed)
Patient verbalizes understanding of discharge instructions. Opportunity for questioning and answers were provided. Armband removed by staff, pt discharged from ED home.  

## 2019-01-01 NOTE — ED Provider Notes (Signed)
[facility] EMERGENCY DEPARTMENT Provider Note   CSN: [id number] Arrival date & time: 01/01/19  1254    History   Chief Complaint Chief Complaint  Patient presents with  . Flu-Like Symptoms    HPI Jill Zamora is a 28 y.o. female.     The history is provided by the patient.  URI  Presenting symptoms: congestion, cough, rhinorrhea and sore throat   Presenting symptoms: no fever   Severity:  Moderate Onset quality:  Gradual Duration:  3 days Timing:  Constant Progression:  Unchanged Chronicity:  New Relieved by:  OTC medications Worsened by:  Nothing Ineffective treatments:  None tried Associated symptoms: no myalgias, no sinus pain, no sneezing and no wheezing   Associated symptoms comment:  Chills.  No SOB Risk factors: sick contacts   Risk factors: no immunosuppression, no recent illness and no recent travel   Risk factors comment:  Smoker but no history of asthma   Past Medical History:  Diagnosis Date  . Alcohol abuse with alcohol-induced mood disorder (HCC) 06/08/2017  . Anxiety   . Cocaine abuse (HCC) 06/08/2017  . Depression     Patient Active Problem List   Diagnosis Date Noted  . Hypokalemia 10/05/2018  . TOA (tubo-ovarian abscess) 10/03/2018  . Marijuana abuse 10/03/2018    Past Surgical History:  Procedure Laterality Date  . NO PAST SURGERIES       OB History    Gravida  0   Para  0   Term  0   Preterm  0   AB  0   Living  0     SAB  0   TAB  0   Ectopic  0   Multiple  0   Live Births  0            Home Medications    Prior to Admission medications   Medication Sig Start Date End Date Taking? Authorizing Provider  acetaminophen (TYLENOL) 325 MG tablet Take 2 tablets (650 mg total) by mouth every 6 (six) hours as needed for mild pain, moderate pain or fever. 10/06/18   [place] Bing, MD  Multiple Vitamin (MULTIVITAMIN WITH MINERALS) TABS tablet Take 1 tablet by mouth daily. 10/06/18   [place] Bing, MD  oxyCODONE-acetaminophen (PERCOCET/ROXICET) 5-325 MG tablet Take 1-2 tablets by mouth every 6 (six) hours as needed. 10/06/18   [place] Bing, MD  polyethylene glycol (MIRALAX / GLYCOLAX) packet Take 17 g by mouth daily. 10/06/18   [place] Bing, MD  potassium chloride (KLOR-CON M15) 15 MEQ tablet Take 2 tablets (30 mEq total) by mouth 2 (two) times daily for 3 doses. 10/06/18 10/08/18  [place] Bing, MD  promethazine (PHENERGAN) 25 MG tablet Take 1 tablet (25 mg total) by mouth every 6 (six) hours as needed for nausea or vomiting. 10/06/18   [place] Bing, MD    Family History No family history on file.  Social History Social History   Tobacco Use  . Smoking status: Current Every Day Smoker    Packs/day: 1.00    Years: 5.00    Pack years: 5.00    Types: Cigarettes  . Smokeless tobacco: Never Used  Substance Use Topics  . Alcohol use: Yes    Comment: occasional   . Drug use: Yes    Types: Cocaine, Marijuana    Comment: last cocaine use  August/ Marijuana Daily     Allergies   Patient has no known allergies.   Review of Systems Review of  Systems  Constitutional: Negative for fever.  HENT: Positive for congestion, rhinorrhea and sore throat. Negative for sinus pain and sneezing.   Respiratory: Positive for cough. Negative for wheezing.   Musculoskeletal: Negative for myalgias.  All other systems reviewed and are negative.    Physical Exam Updated Vital Signs BP 107/62 (BP Location: Right Arm)   Pulse 73   Temp 98.3 F (36.8 C) (Oral)   Resp 18   Ht 5\' 6"  (1.676 m)   Wt 115.2 kg   LMP 12/06/2018   SpO2 96%   BMI 41.00 kg/m   Physical Exam Vitals signs and nursing note reviewed.  Constitutional:      General: She is not in acute distress.    Appearance: She is well-developed.  HENT:     Head: Normocephalic and atraumatic.     Right Ear: Tympanic membrane normal.     Left Ear: Tympanic membrane normal.     Nose: Mucosal edema and  congestion present.     Mouth/Throat:     Pharynx: Posterior oropharyngeal erythema present. No pharyngeal swelling or oropharyngeal exudate.     Tonsils: No tonsillar exudate.  Eyes:     Pupils: Pupils are equal, round, and reactive to light.  Cardiovascular:     Rate and Rhythm: Normal rate and regular rhythm.     Heart sounds: Normal heart sounds. No murmur. No friction rub.  Pulmonary:     Effort: Pulmonary effort is normal.     Breath sounds: Normal breath sounds. No wheezing or rales.  Abdominal:     General: Bowel sounds are normal. There is no distension.     Palpations: Abdomen is soft.     Tenderness: There is no abdominal tenderness. There is no guarding or rebound.  Musculoskeletal: Normal range of motion.        General: No tenderness.     Comments: No edema  Skin:    General: Skin is warm and dry.     Findings: No rash.  Neurological:     Mental Status: She is alert and oriented to person, place, and time. Mental status is at baseline.     Cranial Nerves: No cranial nerve deficit.  Psychiatric:        Mood and Affect: Mood normal.        Behavior: Behavior normal.      ED Treatments / Results  Labs (all labs ordered are listed, but only abnormal results are displayed) Labs Reviewed - No data to display  EKG None  Radiology No results found.  Procedures Procedures (including critical care time)  Medications Ordered in ED Medications - No data to display   Initial Impression / Assessment and Plan / ED Course  I have reviewed the triage vital signs and the nursing notes.  Pertinent labs & imaging results that were available during my care of the patient were reviewed by me and considered in my medical decision making (see chart for details).        Pt with symptoms consistent with viral URI.  Well appearing here.  No signs of breathing difficulty  No signs of pharyngitis, otitis or abnormal abdominal findings.   pt to return with any further  problems.   Final Clinical Impressions(s) / ED Diagnoses   Final diagnoses:  Viral URI with cough    ED Discharge Orders    None       Gwyneth Sprout, MD 01/01/19 1359

## 2019-01-01 NOTE — ED Triage Notes (Signed)
Pt reports cough, body aches, and chills for the past 2-3 days.

## 2019-04-27 ENCOUNTER — Encounter (HOSPITAL_COMMUNITY): Payer: Self-pay | Admitting: Emergency Medicine

## 2019-04-27 ENCOUNTER — Other Ambulatory Visit: Payer: Self-pay

## 2019-04-27 ENCOUNTER — Emergency Department (HOSPITAL_COMMUNITY)
Admission: EM | Admit: 2019-04-27 | Discharge: 2019-04-27 | Disposition: A | Discharge status: Home / Self Care | Payer: Self-pay | Attending: Emergency Medicine | Admitting: Emergency Medicine

## 2019-04-27 DIAGNOSIS — W208XXA Other cause of strike by thrown, projected or falling object, initial encounter: Secondary | ICD-10-CM | POA: Insufficient documentation

## 2019-04-27 DIAGNOSIS — Z79899 Other long term (current) drug therapy: Secondary | ICD-10-CM | POA: Insufficient documentation

## 2019-04-27 DIAGNOSIS — Y999 Unspecified external cause status: Secondary | ICD-10-CM | POA: Insufficient documentation

## 2019-04-27 DIAGNOSIS — Y92511 Restaurant or cafe as the place of occurrence of the external cause: Secondary | ICD-10-CM | POA: Insufficient documentation

## 2019-04-27 DIAGNOSIS — Y9389 Activity, other specified: Secondary | ICD-10-CM | POA: Insufficient documentation

## 2019-04-27 DIAGNOSIS — F17210 Nicotine dependence, cigarettes, uncomplicated: Secondary | ICD-10-CM | POA: Insufficient documentation

## 2019-04-27 DIAGNOSIS — S9031XA Contusion of right foot, initial encounter: Secondary | ICD-10-CM | POA: Insufficient documentation

## 2019-04-27 NOTE — ED Triage Notes (Signed)
Pt states she dropped something on her foot last week and missed work- she was not seen or treated anywhere and today when she went back to work they would not let her return without A note.

## 2019-04-27 NOTE — Discharge Instructions (Signed)
Please see the information and instructions below regarding your visit.  Your diagnoses today include:  1. Contusion of right foot, initial encounter      Tests performed today include: See side panel of your discharge paperwork for testing performed today. Vital signs are listed at the bottom of these instructions.   Medications prescribed:    Take any prescribed medications only as prescribed, and any over the counter medications only as directed on the packaging.  Home care instructions:  Please follow any educational materials contained in this packet.   Follow-up instructions:   Return instructions:  Please return to the Emergency Department if you experience worsening symptoms.  Please return if you have any other emergent concerns.  Additional Information:   Your vital signs today were: BP 133/60 (BP Location: Right Arm)    Pulse 70    Temp 98 F (36.7 C) (Oral)    Resp 16    SpO2 99%  If your blood pressure (BP) was elevated on multiple readings during this visit above 130 for the top number or above 80 for the bottom number, please have this repeated by your primary care provider within one month. --------------  Thank you for allowing [place] to participate in your care today.

## 2019-04-27 NOTE — ED Provider Notes (Signed)
[facility]   CSN: [id number] Arrival date & time: 04/27/19  1825     History   Chief Complaint Chief Complaint  Patient presents with   Letter for School/Work    HPI Jill Zamora is a 28 y.o. female.     HPI  Patient is a 28 year old female with a past medical history of anxiety, cocaine use, EtOH use presenting for right foot injury.  This occurred approximately 1 week ago.  Patient reports that she works in [employer] and she dropped a vat used to contain oil on her foot.  She reports that initially she had pain and was having difficulty walking on it but now she feels back to normal.  She had minimal bruising and no numbness.  Patient reports that she needed a note to go back to work.  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 Authorizing Provider  acetaminophen (TYLENOL) 325 MG tablet Take 2 tablets (650 mg total) by mouth every 6 (six) hours as needed for mild pain, moderate pain or fever. 10/06/18   [place] BingPickens, Charlie, MD  Multiple Vitamin (MULTIVITAMIN WITH MINERALS) TABS tablet Take 1 tablet by mouth daily. 10/06/18   [place] BingPickens, Charlie, MD  oxyCODONE-acetaminophen (PERCOCET/ROXICET) 5-325 MG tablet Take 1-2 tablets by mouth every 6 (six) hours as needed. 10/06/18   [place] BingPickens, Charlie,  MD  polyethylene glycol (MIRALAX / GLYCOLAX) packet Take 17 g by mouth daily. 10/06/18   [place] BingPickens, Charlie, MD  potassium chloride (KLOR-CON M15) 15 MEQ tablet Take 2 tablets (30 mEq total) by mouth 2 (two) times daily for 3 doses. 10/06/18 10/08/18  [place] BingPickens, Charlie, MD  promethazine (PHENERGAN) 25 MG tablet Take 1 tablet (25 mg total) by mouth every 6 (six) hours as needed for nausea or vomiting. 10/06/18    BingPickens, Charlie, MD    Family History No family history on file.  Social History Social History   Tobacco Use   Smoking status: Current Every Day Smoker    Packs/day: 1.00    Years: 5.00    Pack years: 5.00    Types: Cigarettes   Smokeless tobacco: Never Used  Substance Use Topics   Alcohol use: Yes    Comment: occasional    Drug use: Yes    Types: Cocaine, Marijuana    Comment: last cocaine use  August/ Marijuana Daily     Allergies   Patient has no known allergies.   Review of Systems Review of Systems  Musculoskeletal: Positive for arthralgias and myalgias.  Skin: Negative for wound.  Neurological: Negative  for weakness and numbness.     Physical Exam Updated Vital Signs BP 133/60 (BP Location: Right Arm)    Pulse 70    Temp 98 F (36.7 C) (Oral)    Resp 16    SpO2 99%   Physical Exam Vitals signs and nursing note reviewed.  Constitutional:      General: She is not in acute distress.    Appearance: She is well-developed. She is not diaphoretic.     Comments: Sitting comfortably in bed.  HENT:     Head: Normocephalic and atraumatic.  Eyes:     General:        Right eye: No discharge.        Left eye: No discharge.     Conjunctiva/sclera: Conjunctivae normal.     Comments: EOMs normal to gross examination.  Neck:     Musculoskeletal: Normal range of motion.  Cardiovascular:     Rate and Rhythm: Normal rate and regular rhythm.     Comments: Intact, 2+ right DP pulse. Abdominal:     General: There is no distension.  Musculoskeletal:         General: No swelling or tenderness.     Comments: Right forefoot demonstrates no diaphoresis or edema.  Dressing present appears very well-healed.  She is no tenderness to palpation of the forefoot, phalanges, fifth metatarsal, navicular, or medial or lateral malleolus. Intact, 2+ DP pulse of the right lower extremity.  Skin:    General: Skin is warm and dry.  Neurological:     Mental Status: She is alert.     Comments: Cranial nerves intact to gross observation. Patient moves extremities without difficulty.  Psychiatric:        Behavior: Behavior normal.        Thought Content: Thought content normal.        Judgment: Judgment normal.      ED Treatments / Results  Labs (all labs ordered are listed, but only abnormal results are displayed) Labs Reviewed - No data to display  EKG    Radiology No results found.  Procedures Procedures (including critical care time)  Medications Ordered in ED Medications - No data to display   Initial Impression / Assessment and Plan / ED Course  I have reviewed the triage vital signs and the nursing notes.  Pertinent labs & imaging results that were available during my care of the patient were reviewed by me and considered in my medical decision making (see chart for details).        This is a well-appearing 28 year old female with a right foot injury 1 week ago.  She has no tenderness currently.  She is a very well healing ecchymosis overlying the foot.  Discussed utility of imaging with the patient, and elected not to image due to lack of bony tenderness.  She is medically cleared to go back to work.  Note provided.  She is going to precautions for any new or worsening symptoms.  Final Clinical Impressions(s) / ED Diagnoses   Final diagnoses:  Contusion of right foot, initial encounter    ED Discharge Orders    None       Tamala Julian 04/27/19 2030    Dorie Rank, MD 04/30/19 1034

## 2019-05-13 IMAGING — CT CT GUIDANCE NEEDLE PLACEMENT
1 of 4 series · 13 of 32 positions shown, 18 images · non-contrast
Comparison: none

INDICATION: 27-year-old with concern for a pelvic abscess and tubo-ovarian
abscess. Plan for CT-guided drainage.

[Series 2: i-spiral 5.0 b31f · axial · 0.98mm/px · z∈[-207,-11]mm · 13 of 65 slices shown, 18 images]
[im 5/65  soft-tissue]
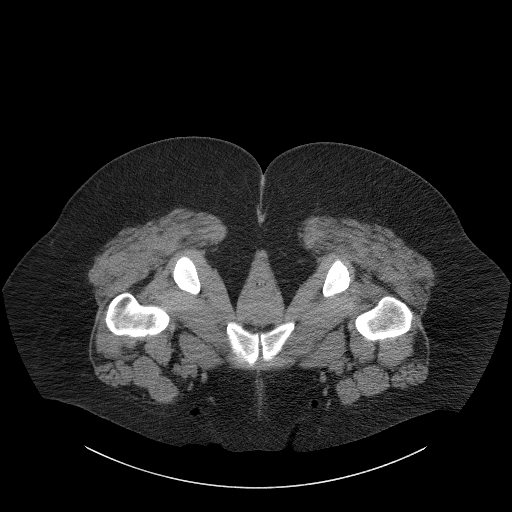
[im 5/65  bone]
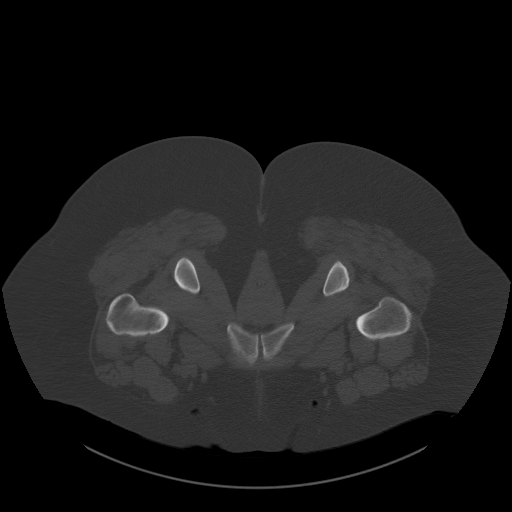
[im 9/65  soft-tissue]
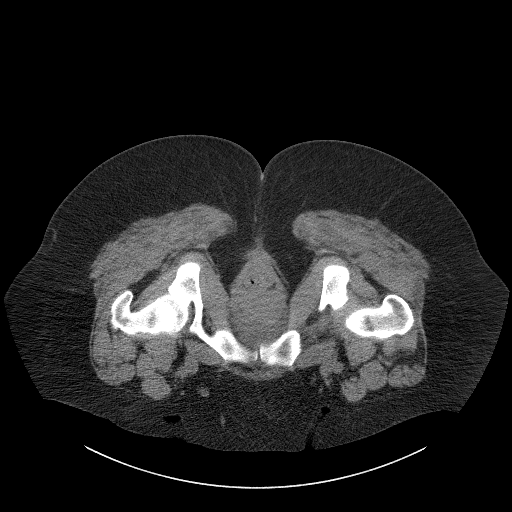
[im 17/65  soft-tissue]
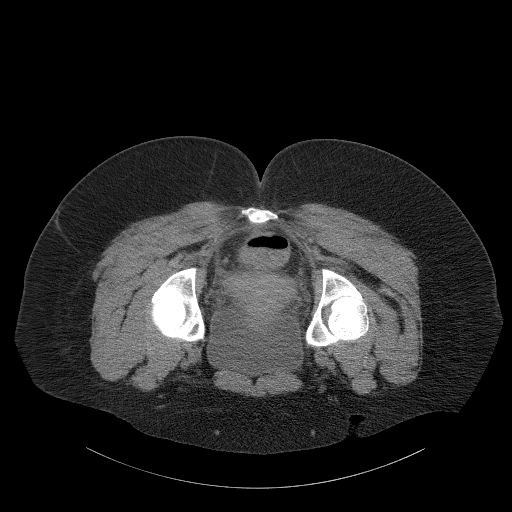
[im 21/65  soft-tissue]
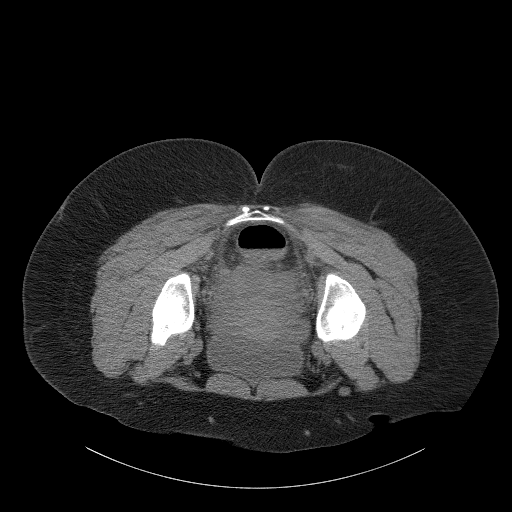
[im 25/65  soft-tissue]
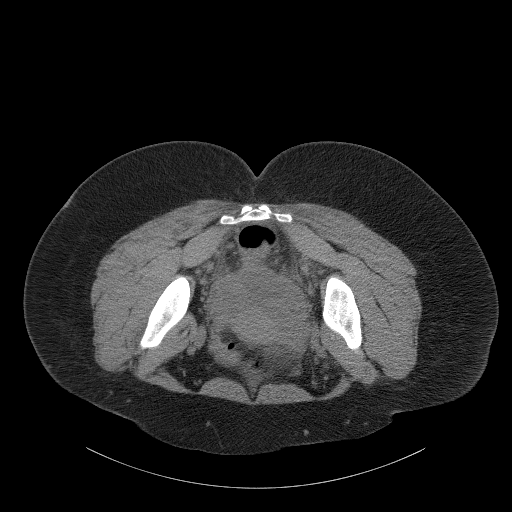
[im 29/65  soft-tissue]
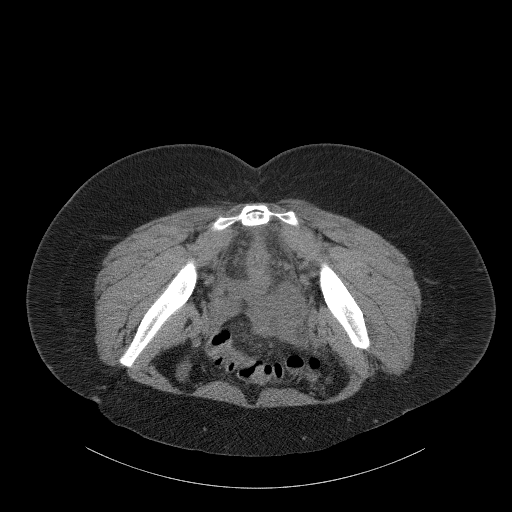
[im 37/65  soft-tissue]
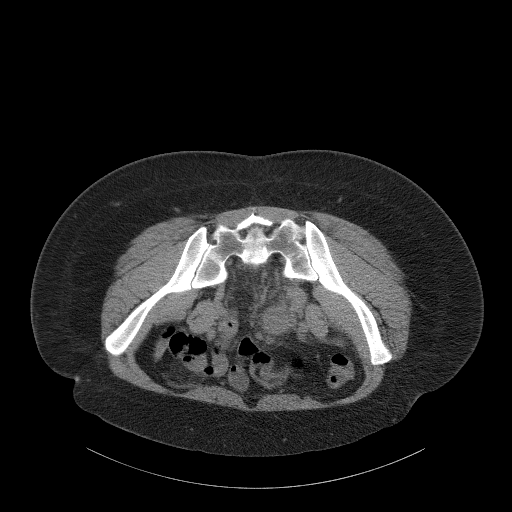
[im 41/65  soft-tissue]
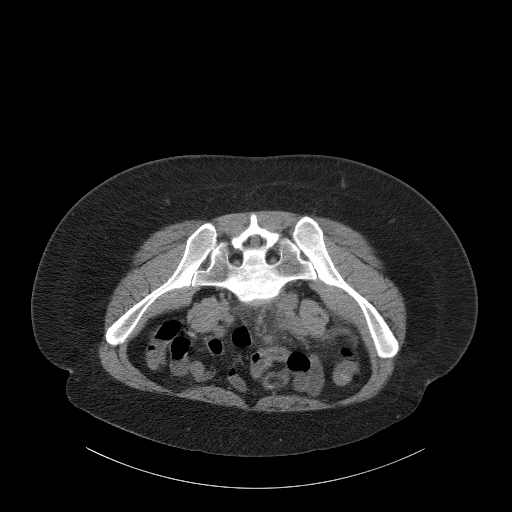
[im 45/65  soft-tissue]
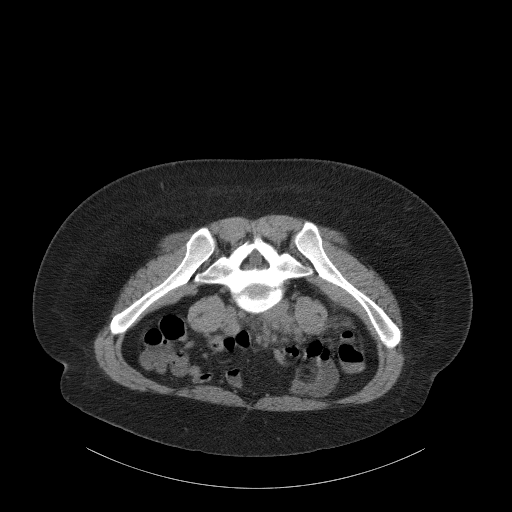
[im 45/65  bone]
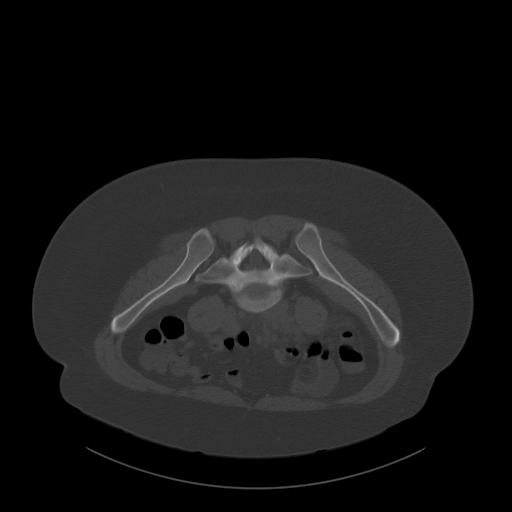
[im 49/65  soft-tissue]
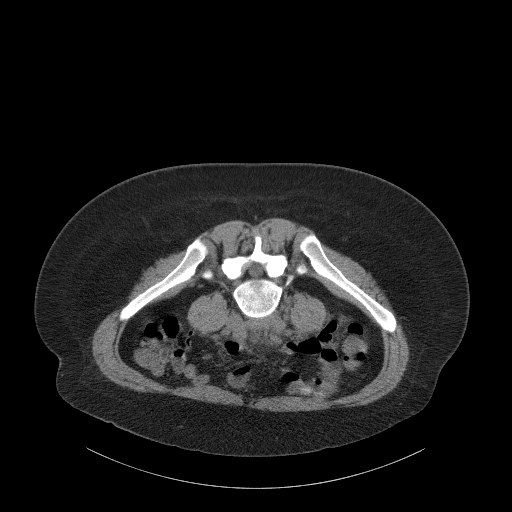
[im 49/65  lung]
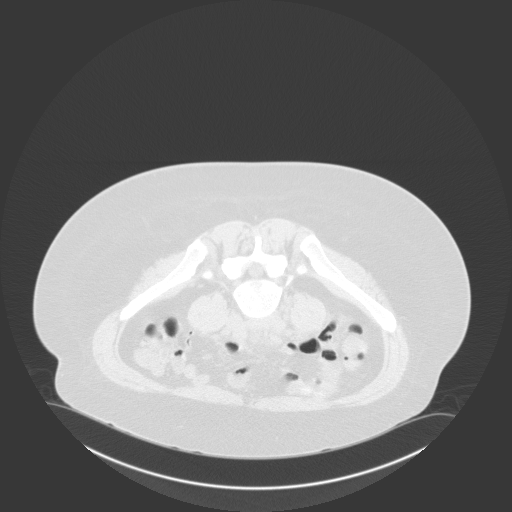
[im 53/65  lung]
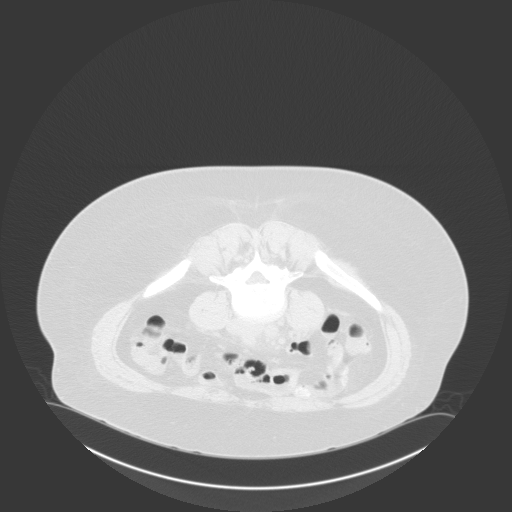
[im 57/65  soft-tissue]
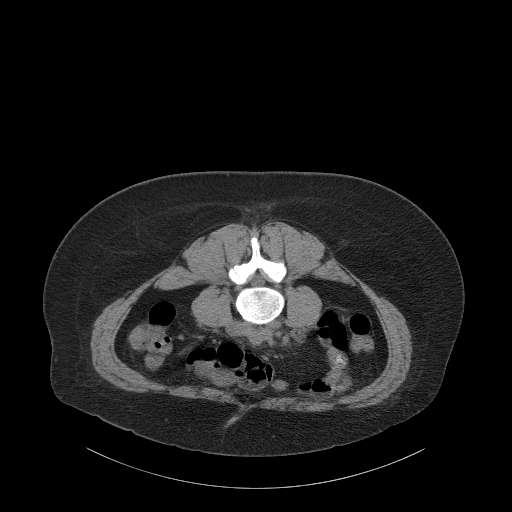
[im 57/65  lung]
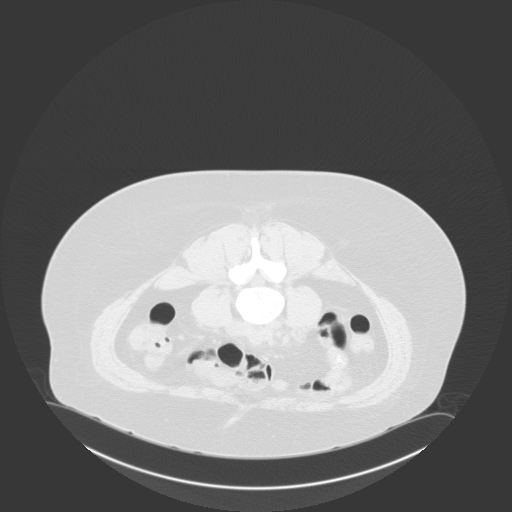
[im 61/65  soft-tissue]
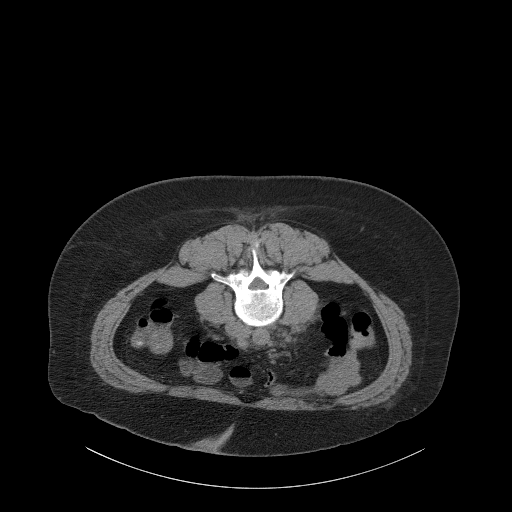
[im 61/65  lung]
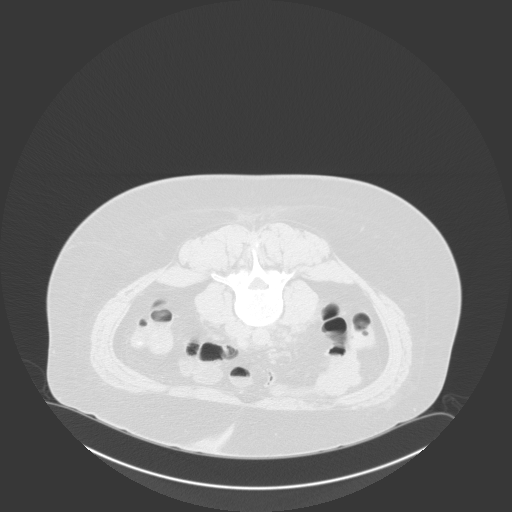

[13 of 32 positions shown; findings below may reference images not displayed]

EXAM:
CT-GUIDED PELVIC COLLECTION ASPIRATION

MEDICATIONS:
The patient is currently admitted to the hospital and receiving
antibiotics.

ANESTHESIA/SEDATION:
Fentanyl 200 mcg IV; Versed 6.0 mg IV

Moderate Sedation Time:  27 minutes

The patient was continuously monitored during the procedure by the
interventional radiology nurse under my direct supervision.

COMPLICATIONS:
None immediate.

PROCEDURE:
Informed written consent was obtained from the patient after a
thorough discussion of the procedural risks, benefits and
alternatives. All questions were addressed. A timeout was performed
prior to the initiation of the procedure.

Patient was placed prone on the CT scanner. Images through the
pelvis were obtained. The left pelvic collection was targeted with a
transgluteal approach. Left buttock was prepped with chlorhexidine
and a sterile field was created. Maximal barrier sterile technique
was utilized including caps, mask, sterile gowns, sterile gloves,
sterile drape, hand hygiene and skin antiseptic. Skin and soft
tissues anesthetized with 1% lidocaine. 18 gauge trocar needle was
directed towards the left pelvic collection with CT guidance. Needle
was advanced into the collection. However, no fluid could be
aspirated. Amplatz wire was advanced through the needle to exclude a
needle occlusion but no significant fluid could be aspirated. Only
free drops of blood was obtained from the 18 gauge trocar needle as
it was removed. A new 18 gauge trocar needle was directed back into
the collection with CT guidance. Again, no significant fluid could
be aspirated. Few drops of blood and small amount of yellow tissue
was aspirated as the needle was completely removed. Bandage placed
over the puncture site.
FINDINGS: Small amount of low-density material posterior to the uterus and
left adnexa. Needle position was confirmed within this low-density
material but only a few drops of blood could be obtained.
IMPRESSION: CT-guided aspiration of the pelvic collection. No significant fluid
could be aspirated. Only a few drops of blood and a small amount of
yellow tissue was collected. Aspirated material was sent for
culture.

## 2020-02-07 ENCOUNTER — Emergency Department (HOSPITAL_COMMUNITY)
Admission: EM | Admit: 2020-02-07 | Discharge: 2020-02-07 | Disposition: A | Discharge status: Home / Self Care | Payer: Self-pay | Attending: Emergency Medicine | Admitting: Emergency Medicine

## 2020-02-07 ENCOUNTER — Encounter (HOSPITAL_COMMUNITY): Payer: Self-pay | Admitting: Emergency Medicine

## 2020-02-07 ENCOUNTER — Other Ambulatory Visit: Payer: Self-pay

## 2020-02-07 DIAGNOSIS — Z5321 Procedure and treatment not carried out due to patient leaving prior to being seen by health care provider: Secondary | ICD-10-CM | POA: Insufficient documentation

## 2020-02-07 DIAGNOSIS — R1010 Upper abdominal pain, unspecified: Secondary | ICD-10-CM | POA: Insufficient documentation

## 2020-02-07 DIAGNOSIS — N939 Abnormal uterine and vaginal bleeding, unspecified: Secondary | ICD-10-CM | POA: Insufficient documentation

## 2020-02-07 LAB — LIPASE, BLOOD: Lipase: 26 U/L (ref 11–51)

## 2020-02-07 LAB — URINALYSIS, ROUTINE W REFLEX MICROSCOPIC
Bacteria, UA: NONE SEEN
Bilirubin Urine: NEGATIVE
Glucose, UA: NEGATIVE mg/dL
Hgb urine dipstick: NEGATIVE
Ketones, ur: NEGATIVE mg/dL
Leukocytes,Ua: NEGATIVE
Nitrite: NEGATIVE
Protein, ur: NEGATIVE mg/dL
Specific Gravity, Urine: 1.012 (ref 1.005–1.030)
pH: 7 (ref 5.0–8.0)

## 2020-02-07 LAB — COMPREHENSIVE METABOLIC PANEL
ALT: 15 U/L (ref 0–44)
AST: 16 U/L (ref 15–41)
Albumin: 3.7 g/dL (ref 3.5–5.0)
Alkaline Phosphatase: 79 U/L (ref 38–126)
Anion gap: 9 (ref 5–15)
BUN: 11 mg/dL (ref 6–20)
CO2: 21 mmol/L — ABNORMAL LOW (ref 22–32)
Calcium: 9.1 mg/dL (ref 8.9–10.3)
Chloride: 107 mmol/L (ref 98–111)
Creatinine, Ser: 0.84 mg/dL (ref 0.44–1.00)
GFR calc Af Amer: >60 mL/min (ref >60)
GFR calc non Af Amer: >60 mL/min (ref >60)
Glucose, Bld: 100 mg/dL — ABNORMAL HIGH (ref 70–99)
Potassium: 3.6 mmol/L (ref 3.5–5.1)
Sodium: 137 mmol/L (ref 135–145)
Total Bilirubin: 0.4 mg/dL (ref 0.3–1.2)
Total Protein: 6.5 g/dL (ref 6.5–8.1)

## 2020-02-07 LAB — CBC
HCT: 40.9 % (ref 36.0–46.0)
Hemoglobin: 13.4 g/dL (ref 12.0–15.0)
MCH: 29.8 pg (ref 26.0–34.0)
MCHC: 32.8 g/dL (ref 30.0–36.0)
MCV: 90.9 fL (ref 80.0–100.0)
Platelets: 382 10*3/uL (ref 150–400)
RBC: 4.5 MIL/uL (ref 3.87–5.11)
RDW: 14 % (ref 11.5–15.5)
WBC: 12.8 10*3/uL — ABNORMAL HIGH (ref 4.0–10.5)
nRBC: 0 % (ref 0.0–0.2)

## 2020-02-07 LAB — I-STAT BETA HCG BLOOD, ED (MC, WL, AP ONLY): I-stat hCG, quantitative: <5 m[IU]/mL (ref <5)

## 2020-02-07 MED ORDER — SODIUM CHLORIDE 0.9% FLUSH: 3.0000 mL | Freq: Once | INTRAVENOUS | Status: DC

## 2020-02-07 NOTE — ED Notes (Signed)
Pt stated she was leaving due to wait time.  

## 2020-02-07 NOTE — ED Triage Notes (Signed)
Patient reports upper abdominal pain today and vaginal bleeding this afternoon , denies emesis or diarrhea , no fever or chills .

## 2022-07-06 ENCOUNTER — Emergency Department (HOSPITAL_COMMUNITY)
Admission: EM | Admit: 2022-07-06 | Discharge: 2022-07-07 | Disposition: A | Discharge status: Home / Self Care | Payer: Self-pay | Attending: Emergency Medicine | Admitting: Emergency Medicine

## 2022-07-06 ENCOUNTER — Encounter (HOSPITAL_COMMUNITY): Payer: Self-pay

## 2022-07-06 ENCOUNTER — Other Ambulatory Visit: Payer: Self-pay

## 2022-07-06 DIAGNOSIS — M419 Scoliosis, unspecified: Secondary | ICD-10-CM

## 2022-07-06 DIAGNOSIS — S39012A Strain of muscle, fascia and tendon of lower back, initial encounter: Secondary | ICD-10-CM

## 2022-07-06 DIAGNOSIS — M545 Low back pain, unspecified: Secondary | ICD-10-CM | POA: Insufficient documentation
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
## 2022-07-07 ENCOUNTER — Emergency Department (HOSPITAL_COMMUNITY): Payer: Self-pay

## 2022-07-07 LAB — I-STAT BETA HCG BLOOD, ED (MC, WL, AP ONLY): I-stat hCG, quantitative: <5 m[IU]/mL (ref <5)

## 2022-07-07 MED ORDER — LIDOCAINE 5 % EX PTCH
3.0000 | MEDICATED_PATCH | CUTANEOUS | Status: DC
  Administered 2022-07-07: 3 via TRANSDERMAL
  Filled 2022-07-07: qty 3

## 2022-07-07 MED ORDER — LIDOCAINE 5 % EX PTCH: 1.0000 | MEDICATED_PATCH | CUTANEOUS | 0 refills | Status: DC

## 2022-07-07 MED ORDER — KETOROLAC TROMETHAMINE 60 MG/2ML IM SOLN
30.0000 mg | Freq: Once | INTRAMUSCULAR | Status: AC
  Administered 2022-07-07: 30 mg via INTRAMUSCULAR
  Filled 2022-07-07: qty 2

## 2022-07-07 MED ORDER — MELOXICAM 7.5 MG PO TABS: 7.5000 mg | ORAL_TABLET | Freq: Every day | ORAL | 0 refills | Status: DC
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
## 2023-05-27 ENCOUNTER — Encounter (HOSPITAL_COMMUNITY): Payer: Self-pay

## 2023-05-27 ENCOUNTER — Emergency Department (HOSPITAL_COMMUNITY)
Admission: EM | Admit: 2023-05-27 | Discharge: 2023-05-27 | Disposition: A | Discharge status: Home / Self Care | Payer: Self-pay | Attending: Emergency Medicine | Admitting: Emergency Medicine

## 2023-05-27 ENCOUNTER — Other Ambulatory Visit: Payer: Self-pay

## 2023-05-27 DIAGNOSIS — Z5900 Homelessness unspecified: Secondary | ICD-10-CM | POA: Insufficient documentation

## 2023-05-27 DIAGNOSIS — L988 Other specified disorders of the skin and subcutaneous tissue: Secondary | ICD-10-CM | POA: Diagnosis not present

## 2023-05-27 DIAGNOSIS — L989 Disorder of the skin and subcutaneous tissue, unspecified: Secondary | ICD-10-CM | POA: Insufficient documentation

## 2023-05-27 LAB — URINALYSIS, ROUTINE W REFLEX MICROSCOPIC
Bilirubin Urine: NEGATIVE
Glucose, UA: NEGATIVE mg/dL
Hgb urine dipstick: NEGATIVE
Ketones, ur: NEGATIVE mg/dL
Leukocytes,Ua: NEGATIVE
Nitrite: POSITIVE — AB
Protein, ur: NEGATIVE mg/dL
Specific Gravity, Urine: 1.03 (ref 1.005–1.030)
pH: 5 (ref 5.0–8.0)

## 2023-05-27 LAB — CBC WITH DIFFERENTIAL/PLATELET
Abs Immature Granulocytes: 0.04 10*3/uL (ref 0.00–0.07)
Basophils Absolute: 0.1 10*3/uL (ref 0.0–0.1)
Basophils Relative: 1 %
Eosinophils Absolute: 0.3 10*3/uL (ref 0.0–0.5)
Eosinophils Relative: 4 %
HCT: 40.2 % (ref 36.0–46.0)
Hemoglobin: 13.2 g/dL (ref 12.0–15.0)
Immature Granulocytes: 1 %
Lymphocytes Relative: 39 %
Lymphs Abs: 2.8 10*3/uL (ref 0.7–4.0)
MCH: 30.5 pg (ref 26.0–34.0)
MCHC: 32.8 g/dL (ref 30.0–36.0)
MCV: 92.8 fL (ref 80.0–100.0)
Monocytes Absolute: 0.7 10*3/uL (ref 0.1–1.0)
Monocytes Relative: 10 %
Neutro Abs: 3.3 10*3/uL (ref 1.7–7.7)
Neutrophils Relative %: 45 %
Platelets: 307 10*3/uL (ref 150–400)
RBC: 4.33 MIL/uL (ref 3.87–5.11)
RDW: 14.2 % (ref 11.5–15.5)
WBC: 7.1 10*3/uL (ref 4.0–10.5)
nRBC: 0 % (ref 0.0–0.2)

## 2023-05-27 LAB — BASIC METABOLIC PANEL
Anion gap: 9 (ref 5–15)
BUN: 14 mg/dL (ref 6–20)
CO2: 24 mmol/L (ref 22–32)
Calcium: 8.4 mg/dL — ABNORMAL LOW (ref 8.9–10.3)
Chloride: 106 mmol/L (ref 98–111)
Creatinine, Ser: 0.82 mg/dL (ref 0.44–1.00)
GFR, Estimated: >60 mL/min (ref >60)
Glucose, Bld: 89 mg/dL (ref 70–99)
Potassium: 3.6 mmol/L (ref 3.5–5.1)
Sodium: 139 mmol/L (ref 135–145)

## 2023-05-27 LAB — HIV ANTIBODY (ROUTINE TESTING W REFLEX): HIV Screen 4th Generation wRfx: NONREACTIVE

## 2023-05-27 LAB — PREGNANCY, URINE: Preg Test, Ur: NEGATIVE

## 2023-05-27 MED ORDER — CEPHALEXIN 500 MG PO CAPS: 500.0000 mg | ORAL_CAPSULE | Freq: Four times a day (QID) | ORAL | 0 refills | Status: DC

## 2023-05-27 MED ORDER — IBUPROFEN 400 MG PO TABS
600.0000 mg | ORAL_TABLET | Freq: Once | ORAL | Status: AC
  Administered 2023-05-27: 600 mg via ORAL
  Filled 2023-05-27: qty 1

## 2023-05-27 MED ORDER — IBUPROFEN 600 MG PO TABS: 600.0000 mg | ORAL_TABLET | Freq: Four times a day (QID) | ORAL | 0 refills | Status: AC | PRN
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
## 2023-05-27 NOTE — Discharge Instructions (Signed)
It was a pleasure taking care of you today.  I suspect these lesions are likely warts.  I have included the number the OB/GYN.  Please call to schedule an appointment for further evaluation.  Your urine showed signs of infection.  I am sending you home with an antibiotic.  Take as prescribed and finish all antibiotics.  I am also sending you home with ibuprofen.  Take as needed for pain.  Return to the ER for new or worsening symptoms.

## 2023-05-27 NOTE — ED Triage Notes (Signed)
Pt c/o bumps to R inner thigh x several months, spreading medially to groin/vaginal area; painful; denies drainage; pt is sexually active

## 2023-05-27 NOTE — ED Provider Notes (Signed)
[place] EMERGENCY DEPARTMENT AT [facility] Provider Note   CSN: [id number] Arrival date & time: 05/27/23  1230     History  No chief complaint on file.   Jill Zamora is a 32 y.o. female with a past medical history significant for depression, anxiety, cocaine abuse, and homelessness who presents to the ED due to lesions to inner thigh and vagina that been present for the past 2 to 3 months.  Patient states area started roughly 2 to 3 months ago as a small "pimple".  Patient notes lesions have spread.  Currently sexually active with 1 partner.  Denies vaginal discharge.  No concern for STIs.  No fever or chills.  No drainage from lesions.  History obtained from patient and past medical records. No interpreter used during encounter.       Home Medications Prior to Admission medications   Medication Sig Start Date End Date Taking? Authorizing Provider  acetaminophen (TYLENOL) 325 MG tablet Take 2 tablets (650 mg total) by mouth every 6 (six) hours as needed for mild pain, moderate pain or fever. Patient not taking: Reported on 07/07/2022 10/06/18   [place] Bing, MD  lidocaine (LIDODERM) 5 % Place 1 patch onto the skin daily. Remove & Discard patch within 12 hours or as directed by MD 07/07/22   Nicanor Alcon, April, MD  meloxicam (MOBIC) 7.5 MG tablet Take 1 tablet (7.5 mg total) by mouth daily. 07/07/22   Palumbo, April, MD  Multiple Vitamin (MULTIVITAMIN WITH MINERALS) TABS tablet Take 1 tablet by mouth daily. Patient not taking: Reported on 07/07/2022 10/06/18   [place] Bing, MD  oxyCODONE-acetaminophen (PERCOCET/ROXICET) 5-325 MG tablet Take 1-2 tablets by mouth every 6 (six) hours as needed. Patient not taking: Reported on 07/07/2022 10/06/18   [place] Bing, MD  polyethylene glycol [facility] Ethelene Hal) packet Take 17 g by mouth daily. Patient not taking: Reported on 07/07/2022 10/06/18   [place] Bing, MD  potassium chloride (KLOR-CON M15) 15 MEQ tablet Take 2  tablets (30 mEq total) by mouth 2 (two) times daily for 3 doses. Patient not taking: Reported on 07/07/2022 10/06/18 10/08/18  [place] Bing, MD  promethazine (PHENERGAN) 25 MG tablet Take 1 tablet (25 mg total) by mouth every 6 (six) hours as needed for nausea or vomiting. Patient not taking: Reported on 07/07/2022 10/06/18   [place] Bing, MD      Allergies    Patient has no known allergies.    Review of Systems   Review of Systems  Constitutional:  Negative for fever.  Genitourinary:  Positive for vaginal pain. Negative for vaginal discharge.  Skin:  Positive for color change and wound.    Physical Exam Updated Vital Signs BP 127/81 (BP Location: Right Arm)   Pulse 89   Temp 97.9 F (36.6 C) (Oral)   Resp 20   SpO2 97%  Physical Exam Vitals and nursing note reviewed.  Constitutional:      General: She is not in acute distress.    Appearance: She is not ill-appearing.  HENT:     Head: Normocephalic.  Eyes:     Pupils: Pupils are equal, round, and reactive to light.  Cardiovascular:     Rate and Rhythm: Normal rate and regular rhythm.     Pulses: Normal pulses.     Heart sounds: Normal heart sounds. No murmur heard.    No friction rub. No gallop.  Pulmonary:     Effort: Pulmonary effort is normal.     Breath sounds: Normal  breath sounds.  Abdominal:     General: Abdomen is flat. There is no distension.     Palpations: Abdomen is soft.     Tenderness: There is no abdominal tenderness. There is no guarding or rebound.  Genitourinary:    Comments: GU exam performed with chaperone in room. Numerous lesions to labia and inner thigh. See photo below (patient agreeable to photo). Musculoskeletal:        General: Normal range of motion.     Cervical back: Neck supple.  Skin:    General: Skin is warm and dry.  Neurological:     General: No focal deficit present.     Mental Status: She is alert.  Psychiatric:        Mood and Affect: Mood normal.        Behavior:  Behavior normal.     ED Results / Procedures / Treatments   Labs (all labs ordered are listed, but only abnormal results are displayed) Labs Reviewed  BASIC METABOLIC PANEL - Abnormal; Notable for the following components:      Result Value   Calcium 8.4 (*)    All other components within normal limits  URINALYSIS, ROUTINE W REFLEX MICROSCOPIC - Abnormal; Notable for the following components:   APPearance HAZY (*)    Nitrite POSITIVE (*)    Bacteria, UA MANY (*)    All other components within normal limits  CBC WITH DIFFERENTIAL/PLATELET  PREGNANCY, URINE  HIV ANTIBODY (ROUTINE TESTING W REFLEX)  RPR    EKG None  Radiology No results found.  Procedures Procedures    Medications Ordered in ED Medications  ibuprofen (ADVIL) tablet 600 mg (600 mg Oral Given 05/27/23 1636)    ED Course/ Medical Decision Making/ A&P                             Medical Decision Making  This patient presents to the ED for concern of thigh/labial lesions, this involves an extensive number of treatment options, and is a complaint that carries with it a high risk of complications and morbidity.  The differential diagnosis includes wart, syphilis, abscess, etc  32 year old female presents to the ED due to lesions to inner thigh and groin area.  Currently homeless.  Currently sexually active with 1 partner with no concern for STIs.  No fever or chills.  Lesion started 2 to 3 months ago and have gradually spread.  No drainage.  No vaginal discharge.  Upon arrival, stable vitals.  Patient in no acute distress.  GU exam performed with chaperone in room. Numerous lesions to inner thigh and labia as shown above.  routine labs ordered in triage.  Suspect lesions likely warts. Lower suspicion for abscesses. Will need OBGYN follow-up for further evaluation.   CBC unremarkable.  No leukocytosis.  Normal hemoglobin.  Pregnancy test negative.  BMP reassuring.  Normal renal function.  No major electrolyte  derangements.  HIV negative.  UA positive for nitrites and many bacteria.  Patient discharged with Keflex for acute cystitis.  RPR pending however, does not look like syphilis lesions.  Patient denies any concern for STIs.  Will hold off on treatment at this time.  Patient given ibuprofen here in the ED.  OB/GYN number given to patient at discharge and advised to call to schedule an appointment for further evaluation.  Patient stable for discharge. Strict ED precautions discussed with patient. Patient states understanding and agrees to plan. Patient discharged  home in no acute distress and stable vitals  Homeless Hx anxiety and depression       Final Clinical Impression(s) / ED Diagnoses Final diagnoses:  Skin lesions    Rx / DC Orders ED Discharge Orders     None         Jesusita Oka 05/27/23 1646    Benjiman Core, MD 05/27/23 2255

## 2023-05-28 LAB — RPR
RPR Ser Ql: REACTIVE — AB
RPR Titer: 1:32 {titer}

## 2023-05-29 LAB — T.PALLIDUM AB, TOTAL: T Pallidum Abs: REACTIVE — AB

## 2023-06-18 ENCOUNTER — Emergency Department (HOSPITAL_COMMUNITY): Payer: 59

## 2023-06-18 ENCOUNTER — Encounter (HOSPITAL_COMMUNITY): Payer: Self-pay

## 2023-06-18 ENCOUNTER — Observation Stay (HOSPITAL_COMMUNITY): Payer: 59

## 2023-06-18 ENCOUNTER — Other Ambulatory Visit: Payer: Self-pay

## 2023-06-18 ENCOUNTER — Other Ambulatory Visit (HOSPITAL_COMMUNITY): Admission: RE | Admit: 2023-06-18 | Payer: 59 | Source: Ambulatory Visit

## 2023-06-18 ENCOUNTER — Inpatient Hospital Stay (HOSPITAL_COMMUNITY)
Admission: EM | Admit: 2023-06-18 | Discharge: 2023-06-21 | DRG: 853 | Disposition: A | Discharge status: Home / Self Care | Payer: 59 | Attending: Internal Medicine | Admitting: Internal Medicine

## 2023-06-18 DIAGNOSIS — F1210 Cannabis abuse, uncomplicated: Secondary | ICD-10-CM | POA: Diagnosis present

## 2023-06-18 DIAGNOSIS — N3289 Other specified disorders of bladder: Secondary | ICD-10-CM | POA: Diagnosis not present

## 2023-06-18 DIAGNOSIS — R6510 Systemic inflammatory response syndrome (SIRS) of non-infectious origin without acute organ dysfunction: Secondary | ICD-10-CM

## 2023-06-18 DIAGNOSIS — U071 COVID-19: Secondary | ICD-10-CM | POA: Diagnosis not present

## 2023-06-18 DIAGNOSIS — N898 Other specified noninflammatory disorders of vagina: Secondary | ICD-10-CM

## 2023-06-18 DIAGNOSIS — N760 Acute vaginitis: Secondary | ICD-10-CM | POA: Diagnosis present

## 2023-06-18 DIAGNOSIS — R400 Somnolence: Secondary | ICD-10-CM | POA: Diagnosis present

## 2023-06-18 DIAGNOSIS — Z5982 Transportation insecurity: Secondary | ICD-10-CM

## 2023-06-18 DIAGNOSIS — R404 Transient alteration of awareness: Secondary | ICD-10-CM | POA: Diagnosis not present

## 2023-06-18 DIAGNOSIS — Z5900 Homelessness unspecified: Secondary | ICD-10-CM

## 2023-06-18 DIAGNOSIS — Z124 Encounter for screening for malignant neoplasm of cervix: Secondary | ICD-10-CM

## 2023-06-18 DIAGNOSIS — A5131 Condyloma latum: Secondary | ICD-10-CM | POA: Diagnosis present

## 2023-06-18 DIAGNOSIS — L03115 Cellulitis of right lower limb: Secondary | ICD-10-CM | POA: Diagnosis present

## 2023-06-18 DIAGNOSIS — B372 Candidiasis of skin and nail: Secondary | ICD-10-CM | POA: Diagnosis present

## 2023-06-18 DIAGNOSIS — F17210 Nicotine dependence, cigarettes, uncomplicated: Secondary | ICD-10-CM | POA: Diagnosis present

## 2023-06-18 DIAGNOSIS — Z5941 Food insecurity: Secondary | ICD-10-CM

## 2023-06-18 DIAGNOSIS — R9431 Abnormal electrocardiogram [ECG] [EKG]: Secondary | ICD-10-CM | POA: Diagnosis not present

## 2023-06-18 DIAGNOSIS — A5149 Other secondary syphilitic conditions: Secondary | ICD-10-CM | POA: Diagnosis present

## 2023-06-18 DIAGNOSIS — L308 Other specified dermatitis: Secondary | ICD-10-CM | POA: Diagnosis not present

## 2023-06-18 DIAGNOSIS — L03116 Cellulitis of left lower limb: Secondary | ICD-10-CM | POA: Diagnosis not present

## 2023-06-18 DIAGNOSIS — A419 Sepsis, unspecified organism: Principal | ICD-10-CM

## 2023-06-18 DIAGNOSIS — R590 Localized enlarged lymph nodes: Secondary | ICD-10-CM | POA: Diagnosis not present

## 2023-06-18 DIAGNOSIS — F1490 Cocaine use, unspecified, uncomplicated: Secondary | ICD-10-CM | POA: Diagnosis present

## 2023-06-18 DIAGNOSIS — L03119 Cellulitis of unspecified part of limb: Secondary | ICD-10-CM | POA: Diagnosis present

## 2023-06-18 DIAGNOSIS — N762 Acute vulvitis: Secondary | ICD-10-CM | POA: Diagnosis present

## 2023-06-18 DIAGNOSIS — R32 Unspecified urinary incontinence: Secondary | ICD-10-CM | POA: Diagnosis present

## 2023-06-18 DIAGNOSIS — Z8619 Personal history of other infectious and parasitic diseases: Secondary | ICD-10-CM

## 2023-06-18 DIAGNOSIS — A539 Syphilis, unspecified: Secondary | ICD-10-CM | POA: Diagnosis present

## 2023-06-18 DIAGNOSIS — L97519 Non-pressure chronic ulcer of other part of right foot with unspecified severity: Secondary | ICD-10-CM | POA: Diagnosis present

## 2023-06-18 DIAGNOSIS — R0689 Other abnormalities of breathing: Secondary | ICD-10-CM | POA: Diagnosis not present

## 2023-06-18 DIAGNOSIS — R4182 Altered mental status, unspecified: Secondary | ICD-10-CM | POA: Diagnosis not present

## 2023-06-18 DIAGNOSIS — Z23 Encounter for immunization: Secondary | ICD-10-CM

## 2023-06-18 DIAGNOSIS — R000 Tachycardia, unspecified: Secondary | ICD-10-CM | POA: Diagnosis not present

## 2023-06-18 DIAGNOSIS — R509 Fever, unspecified: Secondary | ICD-10-CM | POA: Diagnosis not present

## 2023-06-18 DIAGNOSIS — R911 Solitary pulmonary nodule: Secondary | ICD-10-CM | POA: Diagnosis present

## 2023-06-18 DIAGNOSIS — F419 Anxiety disorder, unspecified: Secondary | ICD-10-CM

## 2023-06-18 DIAGNOSIS — N899 Noninflammatory disorder of vagina, unspecified: Secondary | ICD-10-CM | POA: Diagnosis not present

## 2023-06-18 HISTORY — PX: HM PAP SMEAR: HM2

## 2023-06-18 HISTORY — PX: BIOPSY VULVA: PRO42

## 2023-06-18 HISTORY — DX: Anogenital (venereal) warts: A63.0

## 2023-06-18 LAB — WET PREP, GENITAL
Clue Cells Wet Prep HPF POC: NONE SEEN
Sperm: NONE SEEN
Trich, Wet Prep: NONE SEEN
WBC, Wet Prep HPF POC: >=10 — AB (ref <10)
Yeast Wet Prep HPF POC: NONE SEEN

## 2023-06-18 LAB — CBC WITH DIFFERENTIAL/PLATELET
Abs Immature Granulocytes: 0.03 10*3/uL (ref 0.00–0.07)
Basophils Absolute: 0 10*3/uL (ref 0.0–0.1)
Basophils Relative: 1 %
Eosinophils Absolute: 0.1 10*3/uL (ref 0.0–0.5)
Eosinophils Relative: 1 %
HCT: 43.7 % (ref 36.0–46.0)
Hemoglobin: 14.2 g/dL (ref 12.0–15.0)
Immature Granulocytes: 0 %
Lymphocytes Relative: 7 %
Lymphs Abs: 0.5 10*3/uL — ABNORMAL LOW (ref 0.7–4.0)
MCH: 30.1 pg (ref 26.0–34.0)
MCHC: 32.5 g/dL (ref 30.0–36.0)
MCV: 92.6 fL (ref 80.0–100.0)
Monocytes Absolute: 0.5 10*3/uL (ref 0.1–1.0)
Monocytes Relative: 7 %
Neutro Abs: 5.8 10*3/uL (ref 1.7–7.7)
Neutrophils Relative %: 84 %
Platelets: 264 10*3/uL (ref 150–400)
RBC: 4.72 MIL/uL (ref 3.87–5.11)
RDW: 14 % (ref 11.5–15.5)
WBC: 6.8 10*3/uL (ref 4.0–10.5)
nRBC: 0 % (ref 0.0–0.2)

## 2023-06-18 LAB — URINALYSIS, W/ REFLEX TO CULTURE (INFECTION SUSPECTED)
Bilirubin Urine: NEGATIVE
Glucose, UA: NEGATIVE mg/dL
Ketones, ur: NEGATIVE mg/dL
Nitrite: NEGATIVE
Protein, ur: NEGATIVE mg/dL
Specific Gravity, Urine: 1.011 (ref 1.005–1.030)
pH: 6 (ref 5.0–8.0)

## 2023-06-18 LAB — CULTURE, BLOOD (SINGLE)
Culture: NO GROWTH
Special Requests: ADEQUATE

## 2023-06-18 LAB — TSH: TSH: 0.568 u[IU]/mL (ref 0.350–4.500)

## 2023-06-18 LAB — CBC
HCT: 40 % (ref 36.0–46.0)
Hemoglobin: 13 g/dL (ref 12.0–15.0)
MCH: 30.4 pg (ref 26.0–34.0)
MCHC: 32.5 g/dL (ref 30.0–36.0)
MCV: 93.5 fL (ref 80.0–100.0)
Platelets: 191 10*3/uL (ref 150–400)
RBC: 4.28 MIL/uL (ref 3.87–5.11)
RDW: 14.1 % (ref 11.5–15.5)
WBC: 5.5 10*3/uL (ref 4.0–10.5)
nRBC: 0 % (ref 0.0–0.2)

## 2023-06-18 LAB — AMMONIA: Ammonia: 31 umol/L (ref 9–35)

## 2023-06-18 LAB — COMPREHENSIVE METABOLIC PANEL
ALT: 10 U/L (ref 0–44)
AST: 19 U/L (ref 15–41)
Albumin: 4 g/dL (ref 3.5–5.0)
Alkaline Phosphatase: 57 U/L (ref 38–126)
Anion gap: 11 (ref 5–15)
BUN: 12 mg/dL (ref 6–20)
CO2: 18 mmol/L — ABNORMAL LOW (ref 22–32)
Calcium: 8.4 mg/dL — ABNORMAL LOW (ref 8.9–10.3)
Chloride: 104 mmol/L (ref 98–111)
Creatinine, Ser: 0.95 mg/dL (ref 0.44–1.00)
GFR, Estimated: >60 mL/min (ref >60)
Glucose, Bld: 86 mg/dL (ref 70–99)
Potassium: 3.7 mmol/L (ref 3.5–5.1)
Sodium: 133 mmol/L — ABNORMAL LOW (ref 135–145)
Total Bilirubin: 0.5 mg/dL (ref 0.3–1.2)
Total Protein: 7.4 g/dL (ref 6.5–8.1)

## 2023-06-18 LAB — CULTURE, BLOOD (ROUTINE X 2)
Culture: NO GROWTH
Special Requests: ADEQUATE

## 2023-06-18 LAB — CREATININE, SERUM
Creatinine, Ser: 0.92 mg/dL (ref 0.44–1.00)
GFR, Estimated: >60 mL/min (ref >60)

## 2023-06-18 LAB — APTT: aPTT: 32 seconds (ref 24–36)

## 2023-06-18 LAB — I-STAT CG4 LACTIC ACID, ED
Lactic Acid, Venous: 2.1 mmol/L (ref 0.5–1.9)
Lactic Acid, Venous: 0.7 mmol/L (ref 0.5–1.9)

## 2023-06-18 LAB — SARS CORONAVIRUS 2 BY RT PCR: SARS Coronavirus 2 by RT PCR: POSITIVE — AB

## 2023-06-18 LAB — PROTIME-INR
INR: 1.1 (ref 0.8–1.2)
Prothrombin Time: 14.7 seconds (ref 11.4–15.2)

## 2023-06-18 LAB — HIV ANTIBODY (ROUTINE TESTING W REFLEX): HIV Screen 4th Generation wRfx: NONREACTIVE

## 2023-06-18 LAB — HCG, SERUM, QUALITATIVE: Preg, Serum: NEGATIVE

## 2023-06-18 MED ORDER — IOHEXOL 350 MG/ML SOLN
75.0000 mL | Freq: Once | INTRAVENOUS | Status: AC | PRN
  Administered 2023-06-18: 75 mL via INTRAVENOUS

## 2023-06-18 MED ORDER — VANCOMYCIN HCL 10 G IV SOLR: 2000.0000 mg | Freq: Once | INTRAVENOUS | Status: DC

## 2023-06-18 MED ORDER — ACETAMINOPHEN 650 MG RE SUPP: 650.0000 mg | Freq: Four times a day (QID) | RECTAL | Status: DC | PRN

## 2023-06-18 MED ORDER — LACTATED RINGERS IV SOLN: INTRAVENOUS | Status: DC

## 2023-06-18 MED ORDER — SODIUM CHLORIDE 0.9 % IV SOLN
2.0000 g | Freq: Once | INTRAVENOUS | Status: AC
  Administered 2023-06-18: 2 g via INTRAVENOUS
  Filled 2023-06-18: qty 12.5

## 2023-06-18 MED ORDER — ONDANSETRON HCL 4 MG/2ML IJ SOLN: 4.0000 mg | Freq: Four times a day (QID) | INTRAMUSCULAR | Status: DC | PRN

## 2023-06-18 MED ORDER — PENICILLIN G BENZATHINE 1200000 UNIT/2ML IM SUSY
2.4000 10*6.[IU] | PREFILLED_SYRINGE | Freq: Once | INTRAMUSCULAR | Status: AC
  Administered 2023-06-18: 2.4 10*6.[IU] via INTRAMUSCULAR
  Filled 2023-06-18: qty 4

## 2023-06-18 MED ORDER — VANCOMYCIN HCL IN DEXTROSE 1-5 GM/200ML-% IV SOLN
1000.0000 mg | Freq: Once | INTRAVENOUS | Status: AC
  Administered 2023-06-18: 1000 mg via INTRAVENOUS
  Filled 2023-06-18: qty 200

## 2023-06-18 MED ORDER — ACETAMINOPHEN 325 MG PO TABS: 650.0000 mg | ORAL_TABLET | Freq: Four times a day (QID) | ORAL | Status: DC | PRN

## 2023-06-18 MED ORDER — TRAZODONE HCL 50 MG PO TABS: 50.0000 mg | ORAL_TABLET | Freq: Every evening | ORAL | Status: DC | PRN

## 2023-06-18 MED ORDER — VANCOMYCIN HCL 10 G IV SOLR
2000.0000 mg | Freq: Once | INTRAVENOUS | Status: DC
  Administered 2023-06-18: 2000 mg via INTRAVENOUS
  Filled 2023-06-18: qty 20
  Filled 2023-06-18: qty 2000

## 2023-06-18 MED ORDER — METRONIDAZOLE 500 MG/100ML IV SOLN
500.0000 mg | Freq: Once | INTRAVENOUS | Status: AC
  Administered 2023-06-18: 500 mg via INTRAVENOUS
  Filled 2023-06-18: qty 100

## 2023-06-18 MED ORDER — HYDRALAZINE HCL 20 MG/ML IJ SOLN: 10.0000 mg | INTRAMUSCULAR | Status: DC | PRN

## 2023-06-18 MED ORDER — METOPROLOL TARTRATE 5 MG/5ML IV SOLN: 5.0000 mg | INTRAVENOUS | Status: DC | PRN

## 2023-06-18 MED ORDER — ACETAMINOPHEN 500 MG PO TABS
1000.0000 mg | ORAL_TABLET | Freq: Once | ORAL | Status: AC
  Administered 2023-06-18: 1000 mg via ORAL
  Filled 2023-06-18: qty 2

## 2023-06-18 MED ORDER — VANCOMYCIN HCL IN DEXTROSE 1-5 GM/200ML-% IV SOLN: 1000.0000 mg | Freq: Once | INTRAVENOUS | Status: DC

## 2023-06-18 MED ORDER — VANCOMYCIN HCL IN DEXTROSE 1-5 GM/200ML-% IV SOLN: 1000.0000 mg | Freq: Two times a day (BID) | INTRAVENOUS | Status: DC

## 2023-06-18 MED ORDER — ONDANSETRON HCL 4 MG/2ML IJ SOLN
4.0000 mg | Freq: Four times a day (QID) | INTRAMUSCULAR | Status: DC | PRN
  Filled 2023-06-18: qty 2

## 2023-06-18 MED ORDER — LIDOCAINE-EPINEPHRINE 1 %-1:100000 IJ SOLN
10.0000 mL | INTRAMUSCULAR | Status: DC
  Filled 2023-06-18 (×2): qty 10

## 2023-06-18 MED ORDER — SODIUM CHLORIDE 0.9 % IV SOLN
2.0000 g | Freq: Three times a day (TID) | INTRAVENOUS | Status: DC
  Administered 2023-06-18: 2 g via INTRAVENOUS
  Filled 2023-06-18: qty 12.5

## 2023-06-18 MED ORDER — LACTATED RINGERS IV BOLUS (SEPSIS)
1000.0000 mL | Freq: Once | INTRAVENOUS | Status: AC
  Administered 2023-06-18: 1000 mL via INTRAVENOUS

## 2023-06-18 MED ORDER — LACTATED RINGERS IV BOLUS (SEPSIS)
500.0000 mL | Freq: Once | INTRAVENOUS | Status: AC
Start: 2023-06-18 — End: 2023-06-18
  Administered 2023-06-18: 500 mL via INTRAVENOUS

## 2023-06-18 MED ORDER — METRONIDAZOLE 500 MG/100ML IV SOLN
500.0000 mg | Freq: Two times a day (BID) | INTRAVENOUS | Status: DC
  Administered 2023-06-18: 500 mg via INTRAVENOUS
  Filled 2023-06-18: qty 100

## 2023-06-18 MED ORDER — ONDANSETRON HCL 4 MG PO TABS
4.0000 mg | ORAL_TABLET | Freq: Four times a day (QID) | ORAL | Status: DC | PRN
  Administered 2023-06-21: 4 mg via ORAL
  Filled 2023-06-18 (×2): qty 1

## 2023-06-18 MED ORDER — IPRATROPIUM-ALBUTEROL 0.5-2.5 (3) MG/3ML IN SOLN: 3.0000 mL | RESPIRATORY_TRACT | Status: DC | PRN

## 2023-06-18 MED ORDER — ACETAMINOPHEN 325 MG PO TABS
650.0000 mg | ORAL_TABLET | Freq: Four times a day (QID) | ORAL | Status: DC | PRN
  Administered 2023-06-18 – 2023-06-21 (×4): 650 mg via ORAL
  Filled 2023-06-18 (×4): qty 2

## 2023-06-18 MED ORDER — LACTATED RINGERS IV SOLN: INTRAVENOUS | Status: AC

## 2023-06-18 MED ORDER — GUAIFENESIN 100 MG/5ML PO LIQD: 5.0000 mL | ORAL | Status: DC | PRN

## 2023-06-18 MED ORDER — KETOROLAC TROMETHAMINE 15 MG/ML IJ SOLN
15.0000 mg | Freq: Four times a day (QID) | INTRAMUSCULAR | Status: DC | PRN
  Administered 2023-06-18: 15 mg via INTRAVENOUS
  Filled 2023-06-18 (×2): qty 1

## 2023-06-18 MED ORDER — ENOXAPARIN SODIUM 40 MG/0.4ML IJ SOSY
40.0000 mg | PREFILLED_SYRINGE | Freq: Every day | INTRAMUSCULAR | Status: DC
  Administered 2023-06-18 – 2023-06-21 (×4): 40 mg via SUBCUTANEOUS
  Filled 2023-06-18 (×4): qty 0.4

## 2023-06-18 MED ORDER — SENNOSIDES-DOCUSATE SODIUM 8.6-50 MG PO TABS
1.0000 | ORAL_TABLET | Freq: Every evening | ORAL | Status: DC | PRN
  Administered 2023-06-18 – 2023-06-19 (×2): 1 via ORAL
  Filled 2023-06-18: qty 1

## 2023-06-18 NOTE — ED Provider Notes (Signed)
[facility]EMER[facility]ENCY DEPT [facility] Emergency Department Provider Note MRN:  [id number]  Arrival date & time: 06/18/23     Chief Complaint   Fever (Homeless pt here with fever and sunburn via ems)   History of Present Illness   Jill Zamora is a 32 y.o. year-old female presents to the ED with chief complaint of fever.  Patient brought in by EMS.  She states that she has had some sores around her vagina for the past 3 weeks.  She denies cough.  She states that she just does not feel well.  Denies any treatments prior to arrival.  History provided by patient.   Review of Systems  Pertinent positive and negative review of systems noted in HPI.    Physical Exam   Vitals:   06/18/23 0400 06/18/23 0525  BP: (!) 117/46 (!) 101/56  Pulse:    Resp: (!) 27 (!) 22  Temp:    SpO2:      CONSTITUTIONAL: Drowsy-appearing, NAD NEURO:  Alert and oriented x 3, CN 3-12 grossly intact EYES:  eyes equal and reactive ENT/NECK:  Supple, no stridor  CARDIO: Tachycardic, regular rhythm, appears well-perfused  PULM:  No respiratory distress, clear to auscultation bilaterally GI/GU:  non-distended, no focal abdominal tenderness, chaperone present for examination of external genitalia, there is several large lesions on her labia and inner thigh, there is associated erythema and purulent/neck products smelling oozing from the lesions MSK/SPINE:  No gross deformities, no edema, moves all extremities  SKIN: As above   *Additional and/or pertinent findings included in MDM below  Diagnostic and Interventional Summary    EKG Interpretation Date/Time:    Ventricular Rate:    PR Interval:    QRS Duration:    QT Interval:    QTC Calculation:   R Axis:      Text Interpretation:         Labs Reviewed  SARS CORONAVIRUS 2 BY RT PCR - Abnormal; Notable for the following components:      Result Value   SARS Coronavirus 2 by RT PCR POSITIVE (*)    All other components within normal limits   COMPREHENSIVE METABOLIC PANEL - Abnormal; Notable for the following components:   Sodium 133 (*)    CO2 18 (*)    Calcium 8.4 (*)    All other components within normal limits  CBC WITH DIFFERENTIAL/PLATELET - Abnormal; Notable for the following components:   Lymphs Abs 0.5 (*)    All other components within normal limits  URINALYSIS, W/ REFLEX TO CULTURE (INFECTION SUSPECTED) - Abnormal; Notable for the following components:   APPearance HAZY (*)    Hgb urine dipstick SMALL (*)    Leukocytes,Ua LARGE (*)    Bacteria, UA RARE (*)    All other components within normal limits  I-STAT CG4 LACTIC ACID, ED - Abnormal; Notable for the following components:   Lactic Acid, Venous 2.1 (*)    All other components within normal limits  CULTURE, BLOOD (SINGLE)  CULTURE, BLOOD (ROUTINE X 2)  CULTURE, BLOOD (ROUTINE X 2)  PROTIME-INR  APTT  HCG, SERUM, QUALITATIVE  HIV ANTIBODY (ROUTINE TESTING W REFLEX)  I-STAT CG4 LACTIC ACID, ED  GC/CHLAMYDIA PROBE AMP ([place]) NOT AT [facility]    CT ABDOMEN PELVIS W CONTRAST  Final Result    DG Chest Port 1 View  Final Result      Medications  lactated ringers infusion (has no administration in time range)  vancomycin (VANCOCIN) IVPB 1000 mg/200 mL  premix (1,000 mg Intravenous New Bag/Given 06/18/23 0515)    Followed by  vancomycin (VANCOCIN) IVPB 1000 mg/200 mL premix (has no administration in time range)  lactated ringers bolus 1,000 mL (1,000 mLs Intravenous New Bag/Given 06/18/23 0334)    And  lactated ringers bolus 1,000 mL (0 mLs Intravenous Stopped 06/18/23 0413)    And  lactated ringers bolus 500 mL (0 mLs Intravenous Stopped 06/18/23 0354)  ceFEPIme (MAXIPIME) 2 g in sodium chloride 0.9 % 100 mL IVPB (0 g Intravenous Stopped 06/18/23 0409)  metroNIDAZOLE (FLAGYL) IVPB 500 mg (0 mg Intravenous Stopped 06/18/23 0354)  acetaminophen (TYLENOL) tablet 1,000 mg (1,000 mg Oral Given 06/18/23 0337)  iohexol (OMNIPAQUE) 350 MG/ML injection 75 mL (75 mLs  Intravenous Contrast Given 06/18/23 0501)     Procedures  /  Critical Care .Critical Care  Performed by: Roxy Horseman, PA-C Authorized by: Roxy Horseman, PA-C   Critical care provider statement:    Critical care time (minutes):  55   Critical care was necessary to treat or prevent imminent or life-threatening deterioration of the following conditions:  Sepsis   Critical care was time spent personally by me on the following activities:  Development of treatment plan with patient or surrogate, discussions with consultants, evaluation of patient's response to treatment, examination of patient, ordering and review of laboratory studies, ordering and review of radiographic studies, ordering and performing treatments and interventions, pulse oximetry, re-evaluation of patient's condition and review of old charts   ED Course and Medical Decision Making  I have reviewed the triage vital signs, the nursing notes, and pertinent available records from the EMR.  Social Determinants Affecting Complexity of Care: Patient is homelessness.   ED Course:    Medical Decision Making Patient here with fever and chills.  She has several lesions and concern for cellulitis of her groin and upper thighs.  Will check CT to rule out gas producing infection.  Sepsis orders placed.  Will start patient on broad-spectrum antibiotics and fluids.  Patient will need admission to hospital.  Amount and/or Complexity of Data Reviewed Labs: ordered.    Details: Initial lactic 2.1, repeat is 0.7 Urinalysis is abnormal, questionable UTI COVID test is positive No marked leukocytosis No significant electrolyte derangement Pregnancy test negative Radiology: ordered.    Details: CT shows soft tissue lesions near the vagina questionable malignancy.  Will have GYN consult. ECG/medicine tests: ordered.  Risk OTC drugs. Prescription drug management. Decision regarding hospitalization.         Consultants: I  consulted with Hospitalist, Dr. Imogene Burn, who is appreciated for admitting. Patient will be seen by the morning team. I consulted with Dr. Jolayne Panther from GYN, who is appreciated for consulting regarding the vaginal lesions and abnormal CT findings.   Treatment and Plan: Patient's exam and diagnostic results are concerning for SIRS, COVID, possible vaginal malignancy.  Feel that patient will need admission to the hospital for further treatment and evaluation.    Final Clinical Impressions(s) / ED Diagnoses     ICD-10-CM   1. COVID  U07.1     2. SIRS (systemic inflammatory response syndrome) (HCC)  R65.10     3. Vaginal lesion  N89.8       ED Discharge Orders     None         Discharge Instructions Discussed with and Provided to Patient:   Discharge Instructions   None      Roxy Horseman, PA-C 06/18/23 0618    Melene Plan, DO 06/18/23  0718  

## 2023-06-18 NOTE — Consult Note (Signed)
[facility]  Total days of antibiotics 2 Reason for Consult: vaginal lesions and syphilis    Referring Physician: amin  Principal Problem:   Cellulitis of left leg Active Problems:   Marijuana abuse   Sepsis due to cellulitis (HCC)   Anxiety   Pulmonary nodule   Syphilis   Vulvar cellulitis    HPI: Jill Zamora is a 32 y.o. female with hx of homelessness, depression/anxiety, remote hx of TOA, who was seen in the ED mid july with painful vaginal lesion that she reports had started 2-3 months prior. She was discharged on cephalexin for acute cystitis, referred to gyn for eval of genital warts. At that time it was a simple pimple. She is sexually active. Symptoms continue to worsen in addition to  reports some foul discharge. She was admitted for worsening symptoms/vulvar cellulitis. Also incidentally having + covid test, no respiratory symptoms. She is also solomnent during this interview, did not elaborate on history.  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   Anxiety    Cocaine abuse (HCC) 06/08/2017   Depression    Genital warts    TOA (tubo-ovarian abscess) 09/2018    Allergies: No Known Allergies  Current antibiotics:   MEDICATIONS:  enoxaparin (LOVENOX) injection  40 mg Subcutaneous Daily   lidocaine-EPINEPHrine  10 mL Intradermal UD    Social History   Tobacco Use   Smoking status: Every Day    Current packs/day: 1.00    Average packs/day: 1 pack/day for 5.0 years (5.0 ttl pk-yrs)    Types: Cigarettes   Smokeless tobacco: Never  Vaping Use   Vaping status: Never Used  Substance Use Topics   Alcohol use: Yes    Comment: occasional    Drug use: Yes    Types: Cocaine, Marijuana    Comment: last cocaine use  August/ Marijuana Daily    History reviewed. No pertinent family history.  Review of Systems - +vaginal lesions   OBJECTIVE: Temp:  [99 F (37.2  C)-102.8 F (39.3 C)] 99.5 F (37.5 C) (08/07 1311) Pulse Rate:  [92-109] 98 (08/07 1311) Resp:  [14-27] 18 (08/07 1311) BP: (91-117)/(42-76) 104/63 (08/07 1311) SpO2:  [99 %-100 %] 100 % (08/07 1311) Weight:  [81.6 kg] 81.6 kg (08/07 0303) Physical Exam  Constitutional:  oriented to person, place, and time. appears well-developed and well-nourished. No distress.  HENT: [place]/AT, PERRLA, no scleral icterus Mouth/Throat: Oropharynx is clear and moist. No oropharyngeal exudate.  Cardiovascular: Normal rate, regular rhythm and normal heart sounds. Exam reveals no gallop and no friction rub.  No murmur heard.  Pulmonary/Chest: Effort normal and breath sounds normal. No respiratory distress.  has no wheezes.  Neck = supple, no nuchal rigidity Abdominal: Soft. Bowel sounds are normal.  exhibits no distension. There is no tenderness.  Lymphadenopathy: no cervical adenopathy. No axillary adenopathy Gu= labial.inner thigh lesion, c/w condyloma lata. Also chancre ulcer small within labia. Surround erythema to inner thighs Neurological: alert and oriented to person, place, and time.  Skin: Skin is warm and dry. No rash noted. No erythema.  Psychiatric: a normal mood and affect.  behavior is normal.    LABS: Results for orders placed or performed during the hospital encounter of 06/18/23 (from the past 48 hour(s))  Comprehensive metabolic panel     Status: Abnormal   Collection Time: 06/18/23  3:18 AM  Result Value Ref Range   Sodium 133 (L)  135 - 145 mmol/L   Potassium 3.7 3.5 - 5.1 mmol/L   Chloride 104 98 - 111 mmol/L   CO2 18 (L) 22 - 32 mmol/L   Glucose, Bld 86 70 - 99 mg/dL    Comment: Glucose reference range applies only to samples taken after fasting for at least 8 hours.   BUN 12 6 - 20 mg/dL   Creatinine, Ser 2.13 0.44 - 1.00 mg/dL   Calcium 8.4 (L) 8.9 - 10.3 mg/dL   Total Protein 7.4 6.5 - 8.1 g/dL   Albumin 4.0 3.5 - 5.0 g/dL   AST 19 15 - 41 U/L   ALT 10 0 - 44 U/L    Alkaline Phosphatase 57 38 - 126 U/L   Total Bilirubin 0.5 0.3 - 1.2 mg/dL   GFR, Estimated >08 >65 mL/min    Comment: (NOTE) Calculated using the CKD-EPI Creatinine Equation (2021)    Anion gap 11 5 - 15    Comment: Performed at [facility], [address] [place], [place] [address]  CBC with Differential     Status: Abnormal   Collection Time: 06/18/23  3:18 AM  Result Value Ref Range   WBC 6.8 4.0 - 10.5 K/uL   RBC 4.72 3.87 - 5.11 MIL/uL   Hemoglobin 14.2 12.0 - 15.0 g/dL   HCT 62.9 52.8 - 41.3 %   MCV 92.6 80.0 - 100.0 fL   MCH 30.1 26.0 - 34.0 pg   MCHC 32.5 30.0 - 36.0 g/dL   RDW 24.4 01.0 - 27.2 %   Platelets 264 150 - 400 K/uL   nRBC 0.0 0.0 - 0.2 %   Neutrophils Relative % 84 %   Neutro Abs 5.8 1.7 - 7.7 K/uL   Lymphocytes Relative 7 %   Lymphs Abs 0.5 (L) 0.7 - 4.0 K/uL   Monocytes Relative 7 %   Monocytes Absolute 0.5 0.1 - 1.0 K/uL   Eosinophils Relative 1 %   Eosinophils Absolute 0.1 0.0 - 0.5 K/uL   Basophils Relative 1 %   Basophils Absolute 0.0 0.0 - 0.1 K/uL   Immature Granulocytes 0 %   Abs Immature Granulocytes 0.03 0.00 - 0.07 K/uL    Comment: Performed at [facility], [address], [place], [place] [address]  Protime-INR     Status: None   Collection Time: 06/18/23  3:18 AM  Result Value Ref Range   Prothrombin Time 14.7 11.4 - 15.2 seconds   INR 1.1 0.8 - 1.2    Comment: (NOTE) INR goal varies based on device and disease states. Performed at [facility], [address] [place], [place] [address]   APTT     Status: None   Collection Time: 06/18/23  3:18 AM  Result Value Ref Range   aPTT 32 24 - 36 seconds    Comment: Performed at [facility], [address], [place], [place] [address]  hCG, serum, qualitative     Status: None   Collection Time: 06/18/23  3:18 AM  Result Value Ref Range   Preg, Serum NEGATIVE NEGATIVE    Comment:        THE SENSITIVITY OF THIS METHODOLOGY IS >10 mIU/mL. Performed at [facility], [address] [place], [place] [address]   HIV Antibody (routine testing w rflx)     Status: None   Collection Time: 06/18/23  3:18 AM  Result Value Ref Range   HIV Screen 4th Generation wRfx  Non Reactive Non Reactive    Comment: Performed at [facility], [address] [place], [place] [address]  TSH     Status: None   Collection Time: 06/18/23  3:18 AM  Result Value Ref Range   TSH 0.568 0.350 - 4.500 uIU/mL    Comment: Performed by a 3rd Generation assay with a functional sensitivity of <=0.01 uIU/mL. Performed at [facility], [address], [place], [place] [address]   Culture, blood (single)     Status: None (Preliminary result)   Collection Time: 06/18/23  3:41 AM   Specimen: BLOOD RIGHT HAND  Result Value Ref Range   Specimen Description BLOOD RIGHT HAND    Special Requests      BOTTLES DRAWN AEROBIC AND ANAEROBIC Blood Culture adequate volume   Culture      NO GROWTH < 12 HOURS Performed at [facility], [address], [place], [place] [address]    Report Status PENDING   I-Stat Lactic Acid, ED     Status: Abnormal   Collection Time: 06/18/23  3:42 AM  Result Value Ref Range   Lactic Acid, Venous 2.1 (HH) 0.5 - 1.9 mmol/L   Comment NOTIFIED PHYSICIAN   Blood Culture (routine x 2)     Status: None (Preliminary result)   Collection Time: 06/18/23  3:45 AM   Specimen: BLOOD LEFT HAND  Result Value Ref Range   Specimen Description BLOOD LEFT HAND    Special Requests      BOTTLES DRAWN AEROBIC AND ANAEROBIC Blood Culture adequate volume   Culture      NO GROWTH < 12 HOURS Performed at [facility], [address] [place], [place] [address]    Report Status PENDING   SARS Coronavirus 2 by RT PCR (hospital order, performed in [facility] hospital lab) *cepheid single result test* Anterior Nasal Swab     Status: Abnormal   Collection Time: 06/18/23  3:56 AM   Specimen: Anterior Nasal Swab  Result Value Ref Range   SARS  Coronavirus 2 by RT PCR POSITIVE (A) NEGATIVE    Comment: Performed at [facility], [address] [place], [place] [address]  Urinalysis, w/ Reflex to Culture (Infection Suspected) -Urine, Clean Catch     Status: Abnormal   Collection Time: 06/18/23  5:15 AM  Result Value Ref Range   Specimen Source URINE, CLEAN CATCH    Color, Urine YELLOW YELLOW   APPearance HAZY (A) CLEAR   Specific Gravity, Urine 1.011 1.005 - 1.030   pH 6.0 5.0 - 8.0   Glucose, UA NEGATIVE NEGATIVE mg/dL   Hgb urine dipstick SMALL (A) NEGATIVE   Bilirubin Urine NEGATIVE NEGATIVE   Ketones, ur NEGATIVE NEGATIVE mg/dL   Protein, ur NEGATIVE NEGATIVE mg/dL   Nitrite NEGATIVE NEGATIVE   Leukocytes,Ua LARGE (A) NEGATIVE   RBC / HPF 0-5 0 - 5 RBC/hpf   WBC, UA 11-20 0 - 5 WBC/hpf    Comment:        Reflex urine culture not performed if WBC <=10, OR if Squamous epithelial cells >5. If Squamous epithelial cells >5 suggest recollection.    Bacteria, UA RARE (A) NONE SEEN   Squamous Epithelial / HPF 6-10 0 - 5 /HPF    Comment: Performed at [facility], [address] [place], [place] [address]  I-Stat Lactic Acid, ED     Status: None   Collection Time: 06/18/23  5:26 AM  Result Value Ref Range   Lactic Acid, Venous 0.7 0.5 - 1.9 mmol/L  CBC     Status: None   Collection Time: 06/18/23  8:35 AM  Result Value Ref Range   WBC 5.5 4.0 - 10.5 K/uL   RBC 4.28 3.87 - 5.11 MIL/uL   Hemoglobin 13.0 12.0 - 15.0 g/dL   HCT 16.1 09.6 - 04.5 %   MCV 93.5 80.0 - 100.0 fL   MCH 30.4 26.0 - 34.0 pg   MCHC 32.5 30.0 - 36.0 g/dL   RDW 40.9 81.1 - 91.4 %   Platelets 191 150 - 400 K/uL   nRBC 0.0 0.0 - 0.2 %    Comment: Performed at [facility], [address] [place], [place] [address]  Creatinine, serum     Status: None   Collection Time: 06/18/23  8:35 AM  Result Value Ref Range   Creatinine, Ser 0.92 0.44 - 1.00 mg/dL   GFR, Estimated >62 >13 mL/min    Comment: (NOTE) Calculated using the  CKD-EPI Creatinine Equation (2021) Performed at [facility], [address], [place], [place] [address]   Ammonia     Status: None   Collection Time: 06/18/23  8:35 AM  Result Value Ref Range   Ammonia 31 9 - 35 umol/L    Comment: Performed at [facility], [address], [place], [place] [address]  Wet prep, genital     Status: Abnormal   Collection Time: 06/18/23  1:30 PM   Specimen: PATH Cytology Cervicovaginal Ancillary Only  Result Value Ref Range   Yeast Wet Prep HPF POC NONE SEEN NONE SEEN   Trich, Wet Prep NONE SEEN NONE SEEN   Clue Cells Wet Prep HPF POC NONE SEEN NONE SEEN   WBC, Wet Prep HPF POC >=10 (A) <10   Sperm NONE SEEN     Comment: Performed at [facility], [address], [place], [place] [address]    MICRO:  IMAGING: CT HEAD WO CONTRAST ( )  Result Date: 06/18/2023 CLINICAL DATA:  32 year old homeless female with altered mental status. Fever, sepsis, thigh cellulitis, positive COVID-19. EXAM: CT HEAD WITHOUT CONTRAST TECHNIQUE: Contiguous axial images were obtained from the base of the skull through the vertex without intravenous contrast. RADIATION DOSE REDUCTION: This exam was performed according to the departmental dose-optimization program which includes automated exposure control, adjustment of the mA and/or kV according to patient size and/or use of iterative reconstruction technique. COMPARISON:  Head CT 06/16/2018. FINDINGS: Brain: Cerebral volume remains normal. No midline shift, ventriculomegaly, mass effect, evidence of mass lesion, intracranial hemorrhage or evidence of cortically based acute infarction. Gray-white matter differentiation is within normal limits throughout the brain. Vascular: No suspicious intracranial vascular hyperdensity. Skull: Negative. Sinuses/Orbits: Mild to moderate bilateral paranasal sinus mucosal thickening not significantly changed from 2019. Tympanic cavities and mastoids are clear. Other: Visualized orbits  and scalp soft tissues are within normal limits. IMPRESSION: 1. Stable, normal noncontrast CT appearance of the brain. 2. Paranasal sinus inflammation, not significantly changed from 2019 and significance doubtful. Electronically Signed   By: Odessa Fleming M.D.   On: 06/18/2023 10:44   CT ABDOMEN PELVIS W CONTRAST  Result Date: 06/18/2023 CLINICAL DATA:  32 year old female presenting with sepsis. Purulent foul-smelling vaginal discharge. EXAM: CT ABDOMEN AND PELVIS WITH CONTRAST TECHNIQUE: Multidetector CT imaging of the abdomen and pelvis was performed using the standard protocol following bolus administration of intravenous contrast. RADIATION DOSE REDUCTION: This exam was  performed according to the departmental dose-optimization program which includes automated exposure control, adjustment of the mA and/or kV according to patient size and/or use of iterative reconstruction technique. CONTRAST:  75mL OMNIPAQUE IOHEXOL 350 MG/ML SOLN COMPARISON:  CT of the abdomen and pelvis 10/03/2018. FINDINGS: Lower chest: Tiny 3-4 mm pulmonary nodules in the right middle lobe (axial image 1 of series 5) and the left lower lobe (axial image 13 of series 5). Hepatobiliary: No suspicious cystic or solid hepatic lesions. No intra or extrahepatic biliary ductal dilatation. Gallbladder is unremarkable in appearance. Pancreas: No pancreatic mass. No pancreatic ductal dilatation. No pancreatic or peripancreatic fluid collections or inflammatory changes. Spleen: Unremarkable. Adrenals/Urinary Tract: Bilateral kidneys and adrenal glands are normal in appearance. No hydroureteronephrosis. Urinary bladder is moderately distended but otherwise unremarkable in appearance. Stomach/Bowel: The appearance of the stomach is normal. There is no pathologic dilatation of small bowel or colon. Normal appendix. Vascular/Lymphatic: No atherosclerotic calcifications are noted in the abdominal aorta or pelvic vasculature. Numerous prominent borderline  enlarged and mildly enlarged inguinal lymph nodes bilaterally, measuring up to 1.3 cm on the left (axial image 80 of series 3). Multiple other prominent borderline enlarged pelvic and retroperitoneal lymph nodes measuring up to 8 mm in short axis in the left para-aortic nodal station (axial image 34 of series 3), nonspecific. Reproductive: Uterus and left ovary are unremarkable in appearance. In the right adnexal region (axial image 70 of series 3 and coronal image 27 of series 6) there is a 4.5 x 2.4 x 3.8 cm low-attenuation lesion, which has an appearance suggestive of a simple ovarian cyst (no imaging follow-up recommended). Asymmetry of soft tissue along the left side of the vaginal introitus (axial image 86 of series 3). Other: No significant volume of ascites.  No pneumoperitoneum. Musculoskeletal: There are no aggressive appearing lytic or blastic lesions noted in the visualized portions of the skeleton. IMPRESSION: 1. No acute findings are noted in the abdomen or pelvis. 2. Asymmetry of soft tissue along the left side of the vaginal introitus with numerous prominent borderline enlarged and mildly enlarged inguinal lymph nodes bilaterally, as well as numerous prominent borderline enlarged pelvic and retroperitoneal lymph nodes. Findings are nonspecific, but could suggest malignancy. Correlation with physical examination is recommended. 3. Tiny 3-4 mm pulmonary nodules in the right middle lobe and left lower lobe, nonspecific, but statistically likely benign. No follow-up needed if patient is low-risk (and has no known or suspected primary neoplasm). Non-contrast chest CT can be considered in 12 months if patient is high-risk. This recommendation follows the consensus statement: Guidelines for Management of Incidental Pulmonary Nodules Detected on CT Images: From the Fleischner Society 2017; Radiology 2017; 284:228-243. Electronically Signed   By: Trudie Reed M.D.   On: 06/18/2023 05:47   DG Chest Port  1 View  Result Date: 06/18/2023 CLINICAL DATA:  Sepsis EXAM: PORTABLE CHEST 1 VIEW COMPARISON:  10/03/2018 FINDINGS: The heart size and mediastinal contours are within normal limits. No focal airspace consolidation or pulmonary edema. The visualized skeletal structures are unremarkable. IMPRESSION: No active disease. Electronically Signed   By: Deatra Robinson M.D.   On: 06/18/2023 03:23     Assessment/Plan:   secondary syphilis with syphilic condyloma lata - will d/c vancomycin and cefepime - will do 1 dose of IM bicillin for treatment - she may also have other STI, will recommend to check for GC or chlamydia and Bacterial vaginosis -she would benefit from speculum exam to see if any signs concerning for PID  and see if similar to CT findings  Surrounding erythema about her inner thigh lesion = it appears candidal, recommend to do nystatin cream.  Once more alert - will discuss if she would like HIV PrEP  Covid-19 = needs 10 day of airborne/contact isolation. No need for antiviral since minimal symptoms not high risk for severe disease

## 2023-06-18 NOTE — Consult Note (Addendum)
Gynecology Consult Note  Date of Consult: 06/18/2023   Requesting Provider: [facility]  Primary OBGYN: None Primary Care Provider: Patient, No Pcp Per  Reason for Consult: Concern for vulvar cellulitis.   History of Present Illness: [name] is a 32 y.o. G0 (LMP: late July 2024) with the above CC.   Patient presented to the ED with AMS, fevers. GYN consulted due to concern for vulvar cellulitis. Patient seen in July in the ED for vulvar lesions and ED recommended outpatient GYN follow up due to no e/o cellulitis and the lesions appeared to be genital warts; they did give her keflex for presumed UTI.  Patient states current s/s have been going on for the past few months and getting worse; no prior s/s. In the ED, patient started on cefepime and admitted to the hospitalist service. CT imaging showed asymmetry of soft tissue on the left side of the vaginal introitus and associated enlarged inguinal, pelvic and retropelvic lymph nodes.   No VB, discharge.   ROS: A 12-point review of systems was performed and negative, except as stated in the above HPI.  OBGYN History: As per HPI. OB History  Gravida Para Term Preterm AB Living  0 0 0 0 0 0  SAB IAB Ectopic Multiple Live Births  0 0 0 0 0   Periods: qmonth, regular History of pap smears: No. History of TOAs in 09/2018 She is currently using  nothing  for contraception.    Past Medical History: 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   Anxiety    Cocaine abuse (HCC) 06/08/2017   Depression    Genital warts    TOA (tubo-ovarian abscess) 09/2018    Past Surgical History: Past Surgical History:  Procedure Laterality Date   NO PAST SURGERIES      Family History:  History reviewed. No pertinent family history.  Social History:  Social History   Socioeconomic History   Marital status: Single    Spouse name: Not on file   Number of children:  Not on file   Years of education: Not on file   Highest education level: Not on file  Occupational History   Not on file  Tobacco Use   Smoking status: Every Day    Current packs/day: 1.00    Average packs/day: 1 pack/day for 5.0 years (5.0 ttl pk-yrs)    Types: Cigarettes   Smokeless tobacco: Never  Vaping Use   Vaping status: Never Used  Substance and Sexual Activity   Alcohol use: Yes    Comment: occasional    Drug use: Yes    Types: Cocaine, Marijuana    Comment: last cocaine use  August/ Marijuana Daily   Sexual activity: Yes    Birth control/protection: None  Other Topics Concern   Not on file  Social History Narrative   Not on file   Social Determinants of Health   Financial Resource Strain: Not on file  Food Insecurity: Food Insecurity Present (06/18/2023)   Hunger Vital Sign    Worried About Running Out of Food in the Last Year: Often true    Ran Out of Food in the Last Year: Often true  Transportation Needs: Unmet Transportation Needs (06/18/2023)   PRAPARE - Administrator, Civil Service (Medical): Yes    Lack of Transportation (Non-Medical): Yes  Physical Activity: Not on file  Stress: Not on file  Social Connections: Not on file  Intimate Partner Violence: Not At Risk (06/18/2023)   Humiliation, Afraid, Rape, and Kick questionnaire    Fear of Current or Ex-Partner: No    Emotionally Abused: No    Physically Abused: No    Sexually Abused: No    Allergy: No Known Allergies  Current Outpatient Medications: Medications Prior to Admission  Medication Sig Dispense Refill Last Dose   cephALEXin (KEFLEX) 500 MG capsule Take 1 capsule (500 mg total) by mouth 4 (four) times daily. (Patient not taking: Reported on 06/18/2023) 20 capsule 0 Not Taking   ibuprofen (ADVIL) 600 MG tablet Take 1 tablet (600 mg total) by mouth every 6 (six) hours as needed. (Patient not taking: Reported on 06/18/2023) 30 tablet 0 Not Taking     Hospital Medications: Current  Facility-Administered Medications  Medication Dose Route Frequency Provider Last Rate Last Admin   acetaminophen (TYLENOL) tablet 650 mg  650 mg Oral Q6H PRN Dimple Nanas, MD   650 mg at 06/18/23 0840   Or   acetaminophen (TYLENOL) suppository 650 mg  650 mg Rectal Q6H PRN Amin, Ankit Chirag, MD       ceFEPIme (MAXIPIME) 2 g in sodium chloride 0.9 % 100 mL IVPB  2 g Intravenous Q8H Daylene Posey, RPH 200 mL/hr at 06/18/23 1307 2 g at 06/18/23 1307   enoxaparin (LOVENOX) injection 40 mg  40 mg Subcutaneous Daily Amin, Ankit Chirag, MD   40 mg at 06/18/23 1054   guaiFENesin (ROBITUSSIN) 100 MG/5ML liquid 5 mL  5 mL Oral Q4H PRN Amin, Ankit Chirag, MD       hydrALAZINE (APRESOLINE) injection 10 mg  10 mg Intravenous Q4H PRN Amin, Ankit Chirag, MD       ipratropium-albuterol (DUONEB) 0.5-2.5 (3) MG/3ML nebulizer solution 3 mL  3 mL Nebulization Q4H PRN Amin, Loura Halt, MD       lactated ringers infusion   Intravenous Continuous Dimple Nanas, MD 100 mL/hr at 06/18/23 0839 New Bag at 06/18/23 0839   metoprolol tartrate (LOPRESSOR) injection 5 mg  5 mg Intravenous Q4H PRN Amin, Ankit Chirag, MD       metroNIDAZOLE (FLAGYL) IVPB 500 mg  500 mg Intravenous BID Amin, Ankit Chirag, MD       ondansetron (ZOFRAN) tablet 4 mg  4 mg Oral Q6H PRN Amin, Ankit Chirag, MD       Or   ondansetron (ZOFRAN) injection 4 mg  4 mg Intravenous Q6H PRN Amin, Ankit Chirag, MD       senna-docusate (Senokot-S) tablet 1 tablet  1 tablet Oral QHS PRN Amin, Ankit Chirag, MD       traZODone (DESYREL) tablet 50 mg  50 mg Oral QHS PRN Amin, Ankit Chirag, MD       vancomycin (VANCOCIN) IVPB 1000 mg/200 mL premix  1,000 mg Intravenous Q12H Daylene Posey, [facility]         Physical Exam:  Current Vital Signs 24h Vital Sign Ranges  T 99.5 F (37.5 C) Temp  Avg: 99.9 F (37.7 C)  Min: 99 F (37.2 C)  Max: 102.8 F (39.3 C)  BP 104/63 BP  Min: 91/57  Max: 117/46  HR 98 Pulse  Avg: 99.4  Min: 92  Max: 109  RR 18  Resp  Avg: 20.8  Min: 14  Max: 27  SaO2 100 % Room Air SpO2  Avg: 99.7 %  Min: 99 %  Max: 100 %       24 Hour I/O Current Shift I/O  Time  Ins Outs 08/06 0701 - 08/07 0700 In: 2801.6  Out: -  No intake/output data recorded.   Patient Vitals for the past 24 hrs:  BP Temp Temp src Pulse Resp SpO2 Height Weight  06/18/23 1311 104/63 99.5 F (37.5 C) Oral 98 18 100 % -- --  06/18/23 0810 (!) 91/57 100 F (37.8 C) Oral 92 16 100 % -- --  06/18/23 0717 (!) 99/54 99 F (37.2 C) Oral 99 19 100 % -- --  06/18/23 0641 105/76 -- -- 99 (!) 22 99 % -- --  06/18/23 0632 -- 99 F (37.2 C) Oral -- -- -- -- --  06/18/23 0622 -- 99.2 F (37.3 C) Oral -- -- -- -- --  06/18/23 0600 100/68 -- -- -- 19 -- -- --  06/18/23 0525 (!) 101/56 -- -- -- (!) 22 -- -- --  06/18/23 0400 (!) 117/46 -- -- -- (!) 27 -- -- --  06/18/23 0345 (!) 114/42 -- -- -- (!) 24 -- -- --  06/18/23 0315 101/73 -- -- -- (!) 27 -- -- --  06/18/23 0303 -- -- -- -- -- -- 5\' 6"  (1.676 m) 81.6 kg  06/18/23 0301 (!) 108/57 (!) 102.8 F (39.3 C) Oral (!) 109 14 100 % -- --  06/18/23 0258 -- -- -- -- -- 99 % -- --    Body mass index is 29.05 kg/m. General appearance: Well nourished, well developed female in no acute distress.  Respiratory: Normal respiratory effort Abdomen: soft, nttp, nd Neuro/Psych:  Normal mood and affect.  Skin:  Warm and dry.  Extremities: no clubbing, cyanosis, or edema.  Lymphatic:  No inguinal lymphadenopathy.  RN chaperoned for exam.  Pelvic exam: is not limited by body habitus        From prior ED visit Laboratory: Beta HCG: negative  Recent Labs  Lab 06/18/23 0318 06/18/23 0835  WBC 6.8 5.5  HGB 14.2 13.0  HCT 43.7 40.0  PLT 264 191   Recent Labs  Lab 06/18/23 0318 06/18/23 0835  NA 133*  --   K 3.7  --   CL 104  --   CO2 18*  --   BUN 12  --   CREATININE 0.95 0.92  CALCIUM 8.4*  --   PROT 7.4  --   BILITOT 0.5  --   ALKPHOS 57  --   ALT 10  --   AST 19  --   GLUCOSE  86  --    Recent Labs  Lab 06/18/23 0318  APTT 32  INR 1.1   No results for input(s): "ABORH" in the last 168 hours.  Imaging:  Narrative & Impression  CLINICAL DATA:  32 year old female presenting with sepsis. Purulent foul-smelling vaginal discharge.   EXAM: CT ABDOMEN AND PELVIS WITH CONTRAST   TECHNIQUE: Multidetector CT imaging of the abdomen and pelvis was performed using the standard protocol following bolus administration of intravenous contrast.   RADIATION DOSE REDUCTION: This exam was performed according to the departmental dose-optimization program which includes automated exposure control, adjustment of the mA and/or kV according to patient size and/or use of iterative reconstruction technique.   CONTRAST:  75mL OMNIPAQUE IOHEXOL 350 MG/ML SOLN   COMPARISON:  CT of the abdomen and pelvis 10/03/2018.   FINDINGS: Lower chest: Tiny 3-4 mm pulmonary nodules in the right middle lobe (axial image 1 of series 5) and the left lower lobe (axial image 13 of series 5).   Hepatobiliary: No suspicious cystic or solid  hepatic lesions. No intra or extrahepatic biliary ductal dilatation. Gallbladder is unremarkable in appearance.   Pancreas: No pancreatic mass. No pancreatic ductal dilatation. No pancreatic or peripancreatic fluid collections or inflammatory changes.   Spleen: Unremarkable.   Adrenals/Urinary Tract: Bilateral kidneys and adrenal glands are normal in appearance. No hydroureteronephrosis. Urinary bladder is moderately distended but otherwise unremarkable in appearance.   Stomach/Bowel: The appearance of the stomach is normal. There is no pathologic dilatation of small bowel or colon. Normal appendix.   Vascular/Lymphatic: No atherosclerotic calcifications are noted in the abdominal aorta or pelvic vasculature. Numerous prominent borderline enlarged and mildly enlarged inguinal lymph nodes bilaterally, measuring up to 1.3 cm on the left (axial image  80 of series 3). Multiple other prominent borderline enlarged pelvic and retroperitoneal lymph nodes measuring up to 8 mm in short axis in the left para-aortic nodal station (axial image 34 of series 3), nonspecific.   Reproductive: Uterus and left ovary are unremarkable in appearance. In the right adnexal region (axial image 70 of series 3 and coronal image 27 of series 6) there is a 4.5 x 2.4 x 3.8 cm low-attenuation lesion, which has an appearance suggestive of a simple ovarian cyst (no imaging follow-up recommended). Asymmetry of soft tissue along the left side of the vaginal introitus (axial image 86 of series 3).   Other: No significant volume of ascites.  No pneumoperitoneum.   Musculoskeletal: There are no aggressive appearing lytic or blastic lesions noted in the visualized portions of the skeleton.   IMPRESSION: 1. No acute findings are noted in the abdomen or pelvis. 2. Asymmetry of soft tissue along the left side of the vaginal introitus with numerous prominent borderline enlarged and mildly enlarged inguinal lymph nodes bilaterally, as well as numerous prominent borderline enlarged pelvic and retroperitoneal lymph nodes. Findings are nonspecific, but could suggest malignancy. Correlation with physical examination is recommended. 3. Tiny 3-4 mm pulmonary nodules in the right middle lobe and left lower lobe, nonspecific, but statistically likely benign. No follow-up needed if patient is low-risk (and has no known or suspected primary neoplasm). Non-contrast chest CT can be considered in 12 months if patient is high-risk. This recommendation follows the consensus statement: Guidelines for Management of Incidental Pulmonary Nodules Detected on CT Images: From the Fleischner Society 2017; Radiology 2017; 284:228-243.     Electronically Signed   By: Trudie Reed M.D.   On: 06/18/2023 05:47   Assessment: Ms. Reyburn is a 32 y.o. G0 with worsening vulvar lesions; pt  improving  Plan: GC/CT, wet prep obtained; will follow up  Most likely infection with genital warts in place. Unlikely to be malignancy, given multiple lesions, but I would like to biopsy a vulvar lesions just to make sure, in addition to a pap smear, given high risk patient will not follow up for outpatient follow up.   Consider starting valtrex treatment course; continue cefepime and vanc  Patient unaware of syphilis diagnosis in mid July from the ED, titer 1:32. 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 Given the titer, I said it is likely a new infection and needs treatment and will let primary team know.   Total time taking care of the patient was 45 minutes, with greater than 50% of the time spent in face to face interaction with the patient.  Cornelia Copa MD Attending Center for [facility]) GYN Consult Phone: [phone] (M-F,  0800-1700) & [phone] (Off hours, weekends, holidays)

## 2023-06-18 NOTE — ED Triage Notes (Signed)
Patient arrived via ems is Homeless. Patient febrile and tachycardic patient sunburned. Patient reports she feels bad and has vaginal lesions x 3 weeks that have not been treated

## 2023-06-18 NOTE — H&P (Signed)
History and Physical    Jill Zamora WUJ:[id number] DOB: Dec 06, 1990 DOA: 06/18/2023  PCP: Patient, No Pcp Per Patient coming from: Home  Chief Complaint: Fever  HPI: Jill Zamora is a 32 y.o. female with medical history significant of depression, anxiety, cocaine use, homelessness was brought to the hospital for evaluation of fever.  Overall patient is a poor historian and she is drowsy.  Apparently patient came to the ED about 3 weeks ago with similar complaints and inner thigh and vaginal lesion which has been ongoing for 2-3 months, she is 1 sexually active partner.  There was suspicion for warts, HIV was negative and she was discharged home with OB/GYN follow-up. This time patient started having fever today and overall health some foul odor from her clothes when she came to the ED.  Patient denying any complaints but clearly upon admission she was noted to be septic secondary to likely bilateral inner thigh cellulitis and she was found to be COVID-positive.  She was started on broad-spectrum antibiotic, GYN Dr. Jolayne Panther was notified who will see the patient.  Medical team was requested to admit the patient. CT of the abdomen pelvis showed concerns of lymphadenopathy in the pelvic area and pulmonary nodule.   Review of Systems: As per HPI otherwise 10 point review of systems negative.  Review of Systems Otherwise negative except as per HPI, including: General: Denies unintended weight loss Resp: Denies cough, shortness of breath Cardiac: Denies chest pain, palpitations, orthopnea, paroxysmal nocturnal dyspnea. GI: Denies abdominal pain, nausea, vomiting, diarrhea or constipation GU: Denies dysuria, frequency, hesitancy or incontinence MS: Denies muscle aches, joint pain or swelling Neuro: Denies headache, neurologic deficits (focal weakness, numbness, tingling), abnormal gait Psych: Denies anxiety, depression, SI/HI/AVH Skin: VAC General In her thigh lesions ID: Denies sick contacts,  exotic exposures, travel  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   Anxiety    Cocaine abuse (HCC) 06/08/2017   Depression     Past Surgical History:  Procedure Laterality Date   NO PAST SURGERIES      SOCIAL HISTORY:  reports that she has been smoking cigarettes. She has a 5 pack-year smoking history. She has never used smokeless tobacco. She reports current alcohol use. She reports current drug use. Drugs: Cocaine and Marijuana.  No Known Allergies  FAMILY HISTORY: History reviewed. No pertinent family history.   Prior to Admission medications   Medication Sig Start Date End Date Taking? Authorizing Provider  cephALEXin (KEFLEX) 500 MG capsule Take 1 capsule (500 mg total) by mouth 4 (four) times daily. Patient not taking: Reported on 06/18/2023 05/27/23   Mannie Stabile, PA-C  ibuprofen (ADVIL) 600 MG tablet Take 1 tablet (600 mg total) by mouth every 6 (six) hours as needed. Patient not taking: Reported on 06/18/2023 05/27/23   Mannie Stabile, [place]    Physical Exam: Vitals:   06/18/23 0622 06/18/23 9562 06/18/23 0641 06/18/23 0717  BP:   105/76 (!) 99/54  Pulse:   99 99  Resp:   (!) 22 19  Temp: 99.2 F (37.3 C) 99 F (37.2 C)  99 F (37.2 C)  TempSrc: Oral Oral  Oral  SpO2:   99% 100%  Weight:      Height:          Constitutional: NAD, calm, comfortable Eyes: PERRL, lids and conjunctivae normal ENMT: Mucous membranes are moist. Posterior pharynx clear of any exudate or lesions.Normal dentition.  Neck: normal, supple,  no masses, no thyromegaly Respiratory: Overall bilateral clear to auscultation Cardiovascular: Regular rate and rhythm, no murmurs / rubs / gallops. No extremity edema. 2+ pedal pulses. No carotid bruits.  Abdomen: no tenderness, no masses palpated. No hepatosplenomegaly. Bowel sounds positive.  Musculoskeletal: no clubbing / cyanosis. No joint deformity upper and lower  extremities. Good ROM, no contractures. Normal muscle tone.  Skin: no rashes, lesions, ulcers. No induration Neurologic: Overall drowsy but answers all the basic questions. Psychiatric: Alert to name place and today's date For now have deferred GYN exam as GYN team has been notified so they can thoroughly perform this.  Labs on Admission: I have personally reviewed following labs and imaging studies  CBC: Recent Labs  Lab 06/18/23 0318  WBC 6.8  NEUTROABS 5.8  HGB 14.2  HCT 43.7  MCV 92.6  PLT 264   Basic Metabolic Panel: Recent Labs  Lab 06/18/23 0318  NA 133*  K 3.7  CL 104  CO2 18*  GLUCOSE 86  BUN 12  CREATININE 0.95  CALCIUM 8.4*   GFR: Estimated Creatinine Clearance: 91.5 mL/min (by C-G formula based on SCr of 0.95 mg/dL). Liver Function Tests: Recent Labs  Lab 06/18/23 0318  AST 19  ALT 10  ALKPHOS 57  BILITOT 0.5  PROT 7.4  ALBUMIN 4.0   No results for input(s): "LIPASE", "AMYLASE" in the last 168 hours. No results for input(s): "AMMONIA" in the last 168 hours. Coagulation Profile: Recent Labs  Lab 06/18/23 0318  INR 1.1   Cardiac Enzymes: No results for input(s): "CKTOTAL", "CKMB", "CKMBINDEX", "TROPONINI" in the last 168 hours. BNP (last 3 results) No results for input(s): "PROBNP" in the last 8760 hours. HbA1C: No results for input(s): "HGBA1C" in the last 72 hours. CBG: No results for input(s): "GLUCAP" in the last 168 hours. Lipid Profile: No results for input(s): "CHOL", "HDL", "LDLCALC", "TRIG", "CHOLHDL", "LDLDIRECT" in the last 72 hours. Thyroid Function Tests: No results for input(s): "TSH", "T4TOTAL", "FREET4", "T3FREE", "THYROIDAB" in the last 72 hours. Anemia Panel: No results for input(s): "VITAMINB12", "FOLATE", "FERRITIN", "TIBC", "IRON", "RETICCTPCT" in the last 72 hours. Urine analysis:    Component Value Date/Time   COLORURINE YELLOW 06/18/2023 0515   APPEARANCEUR HAZY (A) 06/18/2023 0515   LABSPEC 1.011 06/18/2023  0515   PHURINE 6.0 06/18/2023 0515   GLUCOSEU NEGATIVE 06/18/2023 0515   HGBUR SMALL (A) 06/18/2023 0515   BILIRUBINUR NEGATIVE 06/18/2023 0515   KETONESUR NEGATIVE 06/18/2023 0515   PROTEINUR NEGATIVE 06/18/2023 0515   UROBILINOGEN 1.0 01/10/2013 1954   NITRITE NEGATIVE 06/18/2023 0515   LEUKOCYTESUR LARGE (A) 06/18/2023 0515   Sepsis Labs: !!!!!!!!!!!!!!!!!!!!!!!!!!!!!!!!!!!!!!!!!!!! @LABRCNTIP (procalcitonin:4,lacticidven:4) ) Recent Results (from the past 240 hour(s))  Culture, blood (single)     Status: None (Preliminary result)   Collection Time: 06/18/23  3:41 AM   Specimen: BLOOD RIGHT HAND  Result Value Ref Range Status   Specimen Description BLOOD RIGHT HAND  Final   Special Requests   Final    BOTTLES DRAWN AEROBIC AND ANAEROBIC Blood Culture adequate volume   Culture   Final    NO GROWTH < 12 HOURS Performed at [facility], [address], [place], [place] [address]    Report Status PENDING  Incomplete  Blood Culture (routine x 2)     Status: None (Preliminary result)   Collection Time: 06/18/23  3:45 AM   Specimen: BLOOD LEFT HAND  Result Value Ref Range Status   Specimen Description BLOOD LEFT HAND  Final  Special Requests   Final    BOTTLES DRAWN AEROBIC AND ANAEROBIC Blood Culture adequate volume   Culture   Final    NO GROWTH < 12 HOURS Performed at [facility], [address] [place], [place] [address]    Report Status PENDING  Incomplete  SARS Coronavirus 2 by RT PCR (hospital order, performed in [facility] hospital lab) *cepheid single result test* Anterior Nasal Swab     Status: Abnormal   Collection Time: 06/18/23  3:56 AM   Specimen: Anterior Nasal Swab  Result Value Ref Range Status   SARS Coronavirus 2 by RT PCR POSITIVE (A) NEGATIVE Final    Comment: Performed at [facility], [address], [place], [place] [address]     Radiological Exams on Admission: CT ABDOMEN PELVIS W CONTRAST  Result Date: 06/18/2023 CLINICAL  DATA:  32 year old female presenting with sepsis. Purulent foul-smelling vaginal discharge. EXAM: CT ABDOMEN AND PELVIS WITH CONTRAST TECHNIQUE: Multidetector CT imaging of the abdomen and pelvis was performed using the standard protocol following bolus administration of intravenous contrast. RADIATION DOSE REDUCTION: This exam was performed according to the departmental dose-optimization program which includes automated exposure control, adjustment of the mA and/or kV according to patient size and/or use of iterative reconstruction technique. CONTRAST:  75mL OMNIPAQUE IOHEXOL 350 MG/ML SOLN COMPARISON:  CT of the abdomen and pelvis 10/03/2018. FINDINGS: Lower chest: Tiny 3-4 mm pulmonary nodules in the right middle lobe (axial image 1 of series 5) and the left lower lobe (axial image 13 of series 5). Hepatobiliary: No suspicious cystic or solid hepatic lesions. No intra or extrahepatic biliary ductal dilatation. Gallbladder is unremarkable in appearance. Pancreas: No pancreatic mass. No pancreatic ductal dilatation. No pancreatic or peripancreatic fluid collections or inflammatory changes. Spleen: Unremarkable. Adrenals/Urinary Tract: Bilateral kidneys and adrenal glands are normal in appearance. No hydroureteronephrosis. Urinary bladder is moderately distended but otherwise unremarkable in appearance. Stomach/Bowel: The appearance of the stomach is normal. There is no pathologic dilatation of small bowel or colon. Normal appendix. Vascular/Lymphatic: No atherosclerotic calcifications are noted in the abdominal aorta or pelvic vasculature. Numerous prominent borderline enlarged and mildly enlarged inguinal lymph nodes bilaterally, measuring up to 1.3 cm on the left (axial image 80 of series 3). Multiple other prominent borderline enlarged pelvic and retroperitoneal lymph nodes measuring up to 8 mm in short axis in the left para-aortic nodal station (axial image 34 of series 3), nonspecific. Reproductive: Uterus and  left ovary are unremarkable in appearance. In the right adnexal region (axial image 70 of series 3 and coronal image 27 of series 6) there is a 4.5 x 2.4 x 3.8 cm low-attenuation lesion, which has an appearance suggestive of a simple ovarian cyst (no imaging follow-up recommended). Asymmetry of soft tissue along the left side of the vaginal introitus (axial image 86 of series 3). Other: No significant volume of ascites.  No pneumoperitoneum. Musculoskeletal: There are no aggressive appearing lytic or blastic lesions noted in the visualized portions of the skeleton. IMPRESSION: 1. No acute findings are noted in the abdomen or pelvis. 2. Asymmetry of soft tissue along the left side of the vaginal introitus with numerous prominent borderline enlarged and mildly enlarged inguinal lymph nodes bilaterally, as well as numerous prominent borderline enlarged pelvic and retroperitoneal lymph nodes. Findings are nonspecific, but could suggest malignancy. Correlation with physical examination is recommended. 3. Tiny 3-4 mm pulmonary nodules in the right middle lobe and left lower lobe, nonspecific, but statistically likely benign. No  follow-up needed if patient is low-risk (and has no known or suspected primary neoplasm). Non-contrast chest CT can be considered in 12 months if patient is high-risk. This recommendation follows the consensus statement: Guidelines for Management of Incidental Pulmonary Nodules Detected on CT Images: From the Fleischner Society 2017; Radiology 2017; 284:228-243. Electronically Signed   By: Trudie Reed M.D.   On: 06/18/2023 05:47   DG Chest Port 1 View  Result Date: 06/18/2023 CLINICAL DATA:  Sepsis EXAM: PORTABLE CHEST 1 VIEW COMPARISON:  10/03/2018 FINDINGS: The heart size and mediastinal contours are within normal limits. No focal airspace consolidation or pulmonary edema. The visualized skeletal structures are unremarkable. IMPRESSION: No active disease. Electronically Signed   By: Deatra Robinson M.D.   On: 06/18/2023 03:23     All images have been reviewed by me personally.    Assessment/Plan Principal Problem:   Cellulitis of left leg Active Problems:   Marijuana abuse   Sepsis due to cellulitis (HCC)   Anxiety   Pulmonary nodule    Sepsis secondary to bilateral inner thigh cellulitis/abnormal vaginal lesion COVID-19 infection -Sepsis physiology is slowly improving.  Cultures have been obtained.  Empirically started on vancomycin, cefepime and Flagyl.  Unsure of those exact vaginal lesion or, could be concerns for malignancy.  GYN team has been consulted.  May need pelvic ultrasound but will defer this to their service.  Previously treponema antibody was positive, will order GC/chlamydia.  UA also appears to be mildly positive but she is already on antibiotics.  Will consult ID for their input and to see if we need to further adjust any antibiotic or send any other workup. - Respiratory symptoms are stable.  As needed bronchodilator, I-S/flutter valve.  Drowsy/altered mental status - Exam is nonfocal.  I am not sure if she is drowsy because she is just tired versus any other issue going on.  Will get CT of the head.  Will check TSH, ammonia, UDS  3-4 mm pulmonary nodule - Outpatient follow-up  History of depression/anxiety/polysubstance abuse - Not on any home meds.   DVT prophylaxis: Lovenox Code Status: Full code Family Communication: None Consults called: GYN Dr. Jolayne Panther called by EDP. Dr Zigmund Daniel from ID Admission status: Observation MedSurg Status is: Observation The patient remains OBS appropriate and will d/c before 2 midnights.   Time Spent: 65 minutes.  >50% of the time was devoted to discussing the patients care, assessment, plan and disposition with other care givers along with counseling the patient about the risks and benefits of treatment.    Guyla Bless Joline Maxcy MD [facility]  If 7PM-7AM, please contact  night-coverage   06/18/2023, 7:46 AM

## 2023-06-18 NOTE — Progress Notes (Signed)
Pharmacy Antibiotic Note  Jill Zamora is a 32 y.o. female admitted on 06/18/2023 presenting with fever, cellulitis.  Pharmacy has been consulted for vancomycin and cefepime dosing.  Cefepime 2g IV x 1 and Vancomycin loading dose given in ED (1g x 2 administered, previous 2g bag found draining in sink, RN unavailable to confirm wasted bag however charted stopped after 1 min so likely no significant amount administered)  Plan: Vancomycin 1g IV q 12h (eAUC 483)  Cefepime 2g IV every 8 hours Monitor renal function, Cx and clinical progression to narrow Vancomycin levels as indicated  Height: 5\' 6"  (167.6 cm) Weight: 81.6 kg (180 lb) IBW/kg (Calculated) : 59.3  Temp (24hrs), Avg:100 F (37.8 C), Min:99 F (37.2 C), Max:102.8 F (39.3 C)  Recent Labs  Lab 06/18/23 0318 06/18/23 0342 06/18/23 0526  WBC 6.8  --   --   CREATININE 0.95  --   --   LATICACIDVEN  --  2.1* 0.7    Estimated Creatinine Clearance: 91.5 mL/min (by C-G formula based on SCr of 0.95 mg/dL).    No Known Allergies  Daylene Posey, PharmD, [facility] Clinical Pharmacist ED Pharmacist Phone # [phone] 06/18/2023 7:38 AM

## 2023-06-18 NOTE — Progress Notes (Signed)
[facility] night cross cover note:   I was notified by RN that the patient has an ulceration on the plantar aspect of the right foot, with the patient reporting that she has have this lesion for several months now.   I subsequently placed order for wound care consultation for assistance with recommendations for wound care relating to ulceration on the plantar surface of the right foot.    Newton Pigg, DO Hospitalist

## 2023-06-18 NOTE — Progress Notes (Signed)
[facility] night cross cover note:   I was notified by RN that the patient is experiencing some refractory discomfort relating to her superficial vaginal infection following dose of prn acetaminophen.  Per my chart review, It appears that the patient is at baseline renal function, with serum creatinine noted to be 0.92.   I subsequently added an order for prn IV Toradol to further address her discomfort.    Newton Pigg, DO Hospitalist

## 2023-06-18 NOTE — ED Notes (Signed)
At bedside with provider for vaginal exam as pt c/o lesions to her genitalia. Foul gangrenous odor present upon removal of clothing. Patient has numerous large necrotic grey wounds to labia vaginal opening and sacrum , groin and inner thighs red and hot to touch.

## 2023-06-18 NOTE — Procedures (Signed)
Pap Smear and Vulvar Biopsy Procedure Note  Pre-operative Diagnosis: multiple vulvar lesions concerning for possible malignancy. Vulvar cellulitis. Need cervical cancer screening  Post-operative Diagnosis: same  Procedure Details  RN chaperoned for exam The risks (including infection, bleeding, pain) and benefits of the procedure were explained to the patient and Written informed consent was obtained.  The patient was placed in the frogged legged position, and a Pederson, lighted speculum inserted in the vagina, and the cervix was visualized and a pap smear performed. Normal appearing vagina and cervix, no blood but yellowish/white/malodorous discharge consistent with BV noted.  The speculum was then removed and attention turned to her vulva. See picture. Left labia majora lesions at 4 o'clock chosen with the lateral site biopsied.  The area was swabbed with betadine and injected with 3mL of 1% lidocaine with epinephrine. Using 4mm punch biopsy, biopsy taken, placed in formalin and sent to pathology. Silver nitrate swabs done at the biopsy site; it was still slightly oozing so a 4x4 gauze sponged was placed against it, in the fold.     Condition: Stable  Complications: None  Plan: I told the patient to keep the gauze there for a few hours and then can gently remove it.  GYN team to follow up pap and biopsy results. Flagyl 500mg  po bid recommended for BV  Cornelia Copa MD Attending Center for [facility]) GYN Consult Phone: [phone] (M-F, 0800-1700) & [phone] (Off hours, weekends, holidays)

## 2023-06-18 NOTE — ED Notes (Signed)
Patient with urinary incontinence in bed and floor, patient able to assist and stand for a short while for me to clean and dry her bed, placed new linen and chuk pads.

## 2023-06-18 NOTE — Sepsis Progress Note (Signed)
Elink monitoring for the code sepsis protocol.  

## 2023-06-18 NOTE — Progress Notes (Signed)
ED Pharmacy Antibiotic Sign Off An antibiotic consult was received from an ED provider for cefepime and vancomycin per pharmacy dosing for sepsis. A chart review was completed to assess appropriateness.   The following one time order(s) were placed:   Cefepime 2g IV x1 per MD Vancomycin 2g IV x1 Flagyl 500mg  IV x1 per MD  Further antibiotic and/or antibiotic pharmacy consults should be ordered by the admitting provider if indicated.   Thank you for allowing pharmacy to be a part of this patient's care.   Arabella Merles, [facility]  Clinical Pharmacist 06/18/23 3:09 AM

## 2023-06-19 DIAGNOSIS — A5149 Other secondary syphilitic conditions: Secondary | ICD-10-CM

## 2023-06-19 DIAGNOSIS — Z124 Encounter for screening for malignant neoplasm of cervix: Secondary | ICD-10-CM | POA: Diagnosis not present

## 2023-06-19 DIAGNOSIS — A630 Anogenital (venereal) warts: Secondary | ICD-10-CM | POA: Diagnosis not present

## 2023-06-19 DIAGNOSIS — N760 Acute vaginitis: Secondary | ICD-10-CM

## 2023-06-19 DIAGNOSIS — R32 Unspecified urinary incontinence: Secondary | ICD-10-CM | POA: Diagnosis not present

## 2023-06-19 DIAGNOSIS — Z5900 Homelessness unspecified: Secondary | ICD-10-CM | POA: Diagnosis not present

## 2023-06-19 DIAGNOSIS — F1210 Cannabis abuse, uncomplicated: Secondary | ICD-10-CM | POA: Diagnosis not present

## 2023-06-19 DIAGNOSIS — Z8619 Personal history of other infectious and parasitic diseases: Secondary | ICD-10-CM | POA: Diagnosis not present

## 2023-06-19 DIAGNOSIS — L03115 Cellulitis of right lower limb: Secondary | ICD-10-CM | POA: Diagnosis not present

## 2023-06-19 DIAGNOSIS — B372 Candidiasis of skin and nail: Secondary | ICD-10-CM | POA: Diagnosis not present

## 2023-06-19 DIAGNOSIS — Z7251 High risk heterosexual behavior: Secondary | ICD-10-CM | POA: Diagnosis not present

## 2023-06-19 DIAGNOSIS — F1490 Cocaine use, unspecified, uncomplicated: Secondary | ICD-10-CM | POA: Diagnosis not present

## 2023-06-19 DIAGNOSIS — R509 Fever, unspecified: Secondary | ICD-10-CM | POA: Diagnosis not present

## 2023-06-19 DIAGNOSIS — L03116 Cellulitis of left lower limb: Secondary | ICD-10-CM | POA: Diagnosis not present

## 2023-06-19 DIAGNOSIS — L97519 Non-pressure chronic ulcer of other part of right foot with unspecified severity: Secondary | ICD-10-CM | POA: Diagnosis not present

## 2023-06-19 DIAGNOSIS — R911 Solitary pulmonary nodule: Secondary | ICD-10-CM | POA: Diagnosis not present

## 2023-06-19 DIAGNOSIS — F17210 Nicotine dependence, cigarettes, uncomplicated: Secondary | ICD-10-CM | POA: Diagnosis not present

## 2023-06-19 DIAGNOSIS — A5131 Condyloma latum: Secondary | ICD-10-CM | POA: Diagnosis not present

## 2023-06-19 DIAGNOSIS — R400 Somnolence: Secondary | ICD-10-CM | POA: Diagnosis not present

## 2023-06-19 DIAGNOSIS — F419 Anxiety disorder, unspecified: Secondary | ICD-10-CM | POA: Diagnosis not present

## 2023-06-19 DIAGNOSIS — L03119 Cellulitis of unspecified part of limb: Secondary | ICD-10-CM | POA: Diagnosis present

## 2023-06-19 DIAGNOSIS — N899 Noninflammatory disorder of vagina, unspecified: Secondary | ICD-10-CM | POA: Diagnosis not present

## 2023-06-19 DIAGNOSIS — U071 COVID-19: Secondary | ICD-10-CM

## 2023-06-19 DIAGNOSIS — Z5982 Transportation insecurity: Secondary | ICD-10-CM | POA: Diagnosis not present

## 2023-06-19 DIAGNOSIS — Z5941 Food insecurity: Secondary | ICD-10-CM | POA: Diagnosis not present

## 2023-06-19 DIAGNOSIS — A419 Sepsis, unspecified organism: Secondary | ICD-10-CM | POA: Diagnosis not present

## 2023-06-19 DIAGNOSIS — N762 Acute vulvitis: Secondary | ICD-10-CM | POA: Diagnosis not present

## 2023-06-19 DIAGNOSIS — Z23 Encounter for immunization: Secondary | ICD-10-CM | POA: Diagnosis not present

## 2023-06-19 LAB — URINALYSIS, COMPLETE (UACMP) WITH MICROSCOPIC
Bacteria, UA: NONE SEEN
Bilirubin Urine: NEGATIVE
Glucose, UA: NEGATIVE mg/dL
Ketones, ur: NEGATIVE mg/dL
Nitrite: NEGATIVE
Protein, ur: NEGATIVE mg/dL
Specific Gravity, Urine: 1.017 (ref 1.005–1.030)
pH: 5 (ref 5.0–8.0)

## 2023-06-19 MED ORDER — LIDOCAINE HCL URETHRAL/MUCOSAL 2 % EX GEL
1.0000 | Freq: Once | CUTANEOUS | Status: AC
  Administered 2023-06-19: 1 via TOPICAL
  Filled 2023-06-19: qty 6

## 2023-06-19 MED ORDER — PNEUMOCOCCAL 20-VAL CONJ VACC 0.5 ML IM SUSY
0.5000 mL | PREFILLED_SYRINGE | INTRAMUSCULAR | Status: AC
  Administered 2023-06-20: 0.5 mL via INTRAMUSCULAR
  Filled 2023-06-19: qty 0.5

## 2023-06-19 MED ORDER — BENZOCAINE-MENTHOL 20-0.5 % EX AERO: 1.0000 | INHALATION_SPRAY | Freq: Four times a day (QID) | CUTANEOUS | Status: DC | PRN

## 2023-06-19 MED ORDER — METRONIDAZOLE 500 MG PO TABS
500.0000 mg | ORAL_TABLET | Freq: Two times a day (BID) | ORAL | Status: DC
  Administered 2023-06-19 – 2023-06-21 (×5): 500 mg via ORAL
  Filled 2023-06-19 (×5): qty 1

## 2023-06-19 NOTE — Progress Notes (Signed)
TR[facility] night cross cover note:   I was notified by RN of the patient's risk for an additional application of lidocaine jelly to her superficial vaginal lesion.  I subsequently placed order for additional application of lidocaine jelly, as requested.     Newton Pigg, DO Hospitalist

## 2023-06-19 NOTE — Hospital Course (Addendum)
32 yo WF medical history significant of depression, anxiety, cocaine use, homelessness was brought to the hospital for evaluation of fever,superficial vaginal infection. In the ED she was drowsy, apparently patient came to the ED about 3 weeks ago with similar complaint in the thigh and vaginal lesion and has been ongoing x 2 to 3 months, has 1 sexually active partner, there was suspicion of warts HIV was negative was discharged home with OB/GYN follow-up. In ED Patient denying any complaints but clearly upon admission she was noted to be septic secondary to likely bilateral inner thigh cellulitis and she was found to be COVID-positive. She was started on broad-spectrum antibiotic, GYN, ID was consulted and admitted  CT of the abdomen pelvis showed concerns of lymphadenopathy in the pelvic area and pulmonary nodule Patient diagnosed with secondary syphilis given IM penicillin x 1 and improved> advised follow-up with health department for her and her partner and her partner will need IM penicillin for exposure ID recommended no sexual intercourse until her partner is treated otherwise she will have infection Candida skin lesion continue with antifungal cream vaginal lesion continue lidocaine cream Condyloma likely syphilitic pathology is pending > need fu at [facility]. On Tuesday and [facility] [phone]:YQIHK number provided  Patient has asymptomatic COVID fifth day of isolation on 8/and will discharge with plan for mask for additional 5 days. Overall patient has improved and she is requesting for discharge 06/21/23.

## 2023-06-19 NOTE — Consult Note (Signed)
WOC Nurse Consult Note:  Reason for Consult: R plantar foot wound  Wound type: full thickness, unknown etiology patient says she had a laceration to this area  Pressure Injury POA: NA, not pressure  Measurement: 1 cm x 1 cm  Wound bed: 100% red dry tissue  Drainage (amount, consistency, odor) none, dry  Periwound: intact, several areas of callus noted to plantar foot  Dressing procedure/placement/frequency: Paint R plantar foot wound with Betadine twice daily. May leave open to air or once Betadine is dry can cover with silicone foam to protect.    Patient says area is exquisitely tender, no induration, warmth, erythema or purulence noted at this visit.    POC discussed with patient, primary RN and MD.  Patient would benefit from visit with podiatry as outpatient for continued care of this wound as she reports it has been there for months.    WOC will not follow at this time. Re-consult if further needs arise.   Thank you,    Priscella Mann MSN, RN-BC, [employer] [phone]

## 2023-06-19 NOTE — Progress Notes (Signed)
PROGRESS NOTE Jill Zamora  ZOX:[id number] DOB: 06/29/1991 DOA: 06/18/2023 PCP: Patient, No Pcp Per  Brief Narrative/Hospital Course: 32 yo WF medical history significant of depression, anxiety, cocaine use, homelessness was brought to the hospital for evaluation of fever,superficial vaginal infection. In the ED she was drowsy, apparently patient came to the ED about 3 weeks ago with similar complaint in the thigh and vaginal lesion and has been ongoing x 2 to 3 months, has 1 sexually active partner, there was suspicion of warts HIV was negative was discharged home with OB/GYN follow-up. In ED Patient denying any complaints but clearly upon admission she was noted to be septic secondary to likely bilateral inner thigh cellulitis and she was found to be COVID-positive. She was started on broad-spectrum antibiotic, GYN, ID was consulted and admitted  CT of the abdomen pelvis showed concerns of lymphadenopathy in the pelvic area and pulmonary nodule     Subjective: Seen and examined. Patient was febrile up to 102.1 at 4 PM on admission 8/7, BP stable not hypoxic This morning she is alert awake oriented, reports she is having worsening of the pain burning sensation in the site of lesion and vagina following biopsy   Assessment and Plan: Principal Problem:   Cellulitis of left leg Active Problems:   Marijuana abuse   Sepsis due to cellulitis (HCC)   Anxiety   Pulmonary nodule   Syphilis   Vulvar cellulitis   Secondary syphilis with syphilic condyloma lata  Vulvar lesions worsening BV: Seen by ID, GYN  S/P biopsy  and PCP 8/7.Advised Flagyl 500 mg twice daily for BV. Given elevated syphillis titer likely reinfection. iD has discontinued vancomycin cefepime, 1 dose of IM Bicillin for treatment, she may also have other STI GC and chlamydia was ordered but negative. ? HIV PrEP if agreeable . UA with pyuria WBC 11-20 LE large nitrite negative blood culture no growth to date. Cont symptomatic pain  management, follow-up on pathology biopsy results to decide further treatment.  Initially suspected sepsis due to# 1: Sepsis rule out antibiotics discontinued  COVID-19 infection, has had fever: Needs 10 days of as needed/contact isolation no need for antiviral since minimal symptoms and not high risk for severe disease  Altered mental status with lethargy/drowsiness: CT head stable unchanged findings.  UDS in process.  TSH ammonia normal  3-4 mm pulmonary nodule: need outpatient follow-up w/ pcp  History of anxiety/depression/polysubstance abuse/former/: Not on home meds, THC consult  DVT prophylaxis: enoxaparin (LOVENOX) injection 40 mg Start: 06/18/23 1000 Code Status:   Code Status: Full Code Family Communication: plan of care discussed with patient at bedside. Patient status is:  admitted as observation but remains hospitalized for ongoing  because of ongoing vaginal pain Level of care: Med-Surg   Dispo: The patient is from: homeless            Anticipated disposition: previous place Objective: Vitals last 24 hrs: Vitals:   06/18/23 1847 06/18/23 2023 06/19/23 0345 06/19/23 0803  BP:  (!) 100/55 92/70 105/69  Pulse:  97 74 73  Resp:  18 18 18   Temp: 100 F (37.8 C) 100 F (37.8 C) 98.1 F (36.7 C) 99.3 F (37.4 C)  TempSrc: Oral Oral  Oral  SpO2:  99% 99% 98%  Weight:      Height:       Weight change:   Physical Examination: General exam: alert awake, older than stated age HEENT:Oral mucosa moist, Ear/Nose WNL grossly Respiratory system: bilaterally clear BS, no use  of accessory muscle Cardiovascular system: S1 & S2 +, No JVD. Gastrointestinal system: Abdomen soft,NT,ND, BS+ Nervous System:Alert, awake, moving extremities. Extremities: LE edema neg,distal peripheral pulses palpable.  Skin: No rashes,no icterus. MSK: Normal muscle bulk,tone, power  Medications reviewed:  Scheduled Meds:  enoxaparin (LOVENOX) injection  40 mg Subcutaneous Daily    lidocaine-EPINEPHrine  10 mL Intradermal UD   metroNIDAZOLE  500 mg Oral Q12H   Continuous Infusions:    Diet Order             Diet regular Room service appropriate? Yes; Fluid consistency: Thin  Diet effective now                  Intake/Output Summary (Last 24 hours) at 06/19/2023 1013 Last data filed at 06/19/2023 0600 Gross per 24 hour  Intake 1587.14 ml  Output --  Net 1587.14 ml   Net IO Since Admission: 4,388.69 mL [06/19/23 1013]  Wt Readings from Last 3 Encounters:  06/18/23 81.6 kg  07/06/22 135 kg  02/07/20 135 kg     Unresulted Labs (From admission, onward)     Start     Ordered   06/25/23 0500  Creatinine, serum  (enoxaparin (LOVENOX)    CrCl >/= 30 ml/min)  Weekly,   R     Comments: while on enoxaparin therapy    06/18/23 0737          Data Reviewed: I have personally reviewed following labs and imaging studies CBC: Recent Labs  Lab 06/18/23 0318 06/18/23 0835 06/19/23 0325  WBC 6.8 5.5 3.2*  NEUTROABS 5.8  --   --   HGB 14.2 13.0 12.1  HCT 43.7 40.0 37.6  MCV 92.6 93.5 96.2  PLT 264 191 165   Basic Metabolic Panel: Recent Labs  Lab 06/18/23 0318 06/18/23 0835 06/19/23 0325  NA 133*  --  138  K 3.7  --  3.5  CL 104  --  109  CO2 18*  --  22  GLUCOSE 86  --  88  BUN 12  --  10  CREATININE 0.95 0.92 1.05*  CALCIUM 8.4*  --  7.8*  MG  --   --  2.0   GFR: Estimated Creatinine Clearance: 82.8 mL/min (A) (by C-G formula based on SCr of 1.05 mg/dL (H)). Liver Function Tests: Recent Labs  Lab 06/18/23 0318  AST 19  ALT 10  ALKPHOS 57  BILITOT 0.5  PROT 7.4  ALBUMIN 4.0   No results for input(s): "LIPASE", "AMYLASE" in the last 168 hours. Recent Labs  Lab 06/18/23 0835  AMMONIA 31   Coagulation Profile: Recent Labs  Lab 06/18/23 0318  INR 1.1   Recent Labs    06/18/23 0318  TSH 0.568   Sepsis Labs: Recent Labs  Lab 06/18/23 0342 06/18/23 0526  LATICACIDVEN 2.1* 0.7   Recent Results (from the past 240  hour(s))  Blood Culture (routine x 2)     Status: None (Preliminary result)   Collection Time: 06/18/23  3:08 AM   Specimen: BLOOD RIGHT HAND  Result Value Ref Range Status   Specimen Description BLOOD RIGHT HAND  Final   Special Requests   Final    AEROBIC BOTTLE ONLY Blood Culture results may not be optimal due to an inadequate volume of blood received in culture bottles   Culture   Final    NO GROWTH < 24 HOURS Performed at [facility], [address] [place], [place] [address]  Report Status PENDING  Incomplete  Culture, blood (single)     Status: None (Preliminary result)   Collection Time: 06/18/23  3:41 AM   Specimen: BLOOD RIGHT HAND  Result Value Ref Range Status   Specimen Description BLOOD RIGHT HAND  Final   Special Requests   Final    BOTTLES DRAWN AEROBIC AND ANAEROBIC Blood Culture adequate volume   Culture   Final    NO GROWTH 1 DAY Performed at [facility], [address] [place], [place] [address]    Report Status PENDING  Incomplete  Blood Culture (routine x 2)     Status: None (Preliminary result)   Collection Time: 06/18/23  3:45 AM   Specimen: BLOOD LEFT HAND  Result Value Ref Range Status   Specimen Description BLOOD LEFT HAND  Final   Special Requests   Final    BOTTLES DRAWN AEROBIC AND ANAEROBIC Blood Culture adequate volume   Culture   Final    NO GROWTH 1 DAY Performed at [facility], [address] [place], [place] [address]    Report Status PENDING  Incomplete  SARS Coronavirus 2 by RT PCR (hospital order, performed in [facility] hospital lab) *cepheid single result test* Anterior Nasal Swab     Status: Abnormal   Collection Time: 06/18/23  3:56 AM   Specimen: Anterior Nasal Swab  Result Value Ref Range Status   SARS Coronavirus 2 by RT PCR POSITIVE (A) NEGATIVE Final    Comment: Performed at [facility], [address], [place], [place] [address]  Wet prep, genital     Status: Abnormal   Collection Time:  06/18/23  1:30 PM   Specimen: PATH Cytology Cervicovaginal Ancillary Only  Result Value Ref Range Status   Yeast Wet Prep HPF POC NONE SEEN NONE SEEN Final   Trich, Wet Prep NONE SEEN NONE SEEN Final   Clue Cells Wet Prep HPF POC NONE SEEN NONE SEEN Final   WBC, Wet Prep HPF POC >=10 (A) <10 Final   Sperm NONE SEEN  Final    Comment: Performed at [facility], [address], [place], [place] [address]    Antimicrobials: Anti-infectives (From admission, onward)    Start     Dose/Rate Route Frequency Ordered Stop   06/19/23 1015  metroNIDAZOLE (FLAGYL) tablet 500 mg        500 mg Oral Every 12 hours 06/19/23 0917 06/26/23 0959   06/18/23 2000  vancomycin (VANCOCIN) IVPB 1000 mg/200 mL premix  Status:  Discontinued        1,000 mg 200 mL/hr over 60 Minutes Intravenous Every 12 hours 06/18/23 0759 06/18/23 1456   06/18/23 1600  penicillin g benzathine (BICILLIN LA) 1200000 UNIT/2ML injection 2.4 Million Units        2.4 Million Units Intramuscular  Once 06/18/23 1520 06/18/23 1554   06/18/23 1200  metroNIDAZOLE (FLAGYL) IVPB 500 mg  Status:  Discontinued        500 mg 100 mL/hr over 60 Minutes Intravenous 2 times daily 06/18/23 0733 06/18/23 1531   06/18/23 1200  ceFEPIme (MAXIPIME) 2 g in sodium chloride 0.9 % 100 mL IVPB  Status:  Discontinued        2 g 200 mL/hr over 30 Minutes Intravenous Every 8 hours 06/18/23 0754 06/18/23 1456   06/18/23 0445  vancomycin (VANCOCIN) IVPB 1000 mg/200 mL premix       Placed in "Followed by" Linked Group   1,000  mg 200 mL/hr over 60 Minutes Intravenous  Once 06/18/23 0342 06/18/23 0734   06/18/23 0345  vancomycin (VANCOCIN) IVPB 1000 mg/200 mL premix       Placed in "Followed by" Linked Group   1,000 mg 200 mL/hr over 60 Minutes Intravenous  Once 06/18/23 0342 06/18/23 0620   06/18/23 0315  ceFEPIme (MAXIPIME) 2 g in sodium chloride 0.9 % 100 mL IVPB        2 g 200 mL/hr over 30 Minutes Intravenous  Once 06/18/23 0307 06/18/23 0409    06/18/23 0315  metroNIDAZOLE (FLAGYL) IVPB 500 mg        500 mg 100 mL/hr over 60 Minutes Intravenous  Once 06/18/23 0307 06/18/23 0354   06/18/23 0315  vancomycin (VANCOCIN) IVPB 1000 mg/200 mL premix  Status:  Discontinued        1,000 mg 200 mL/hr over 60 Minutes Intravenous  Once 06/18/23 0307 06/18/23 0308   06/18/23 0315  vancomycin (VANCOCIN) 2,000 mg in sodium chloride 0.9 % 500 mL IVPB  Status:  Discontinued        2,000 mg 260 mL/hr over 120 Minutes Intravenous  Once 06/18/23 0310 06/18/23 0311   06/18/23 0315  vancomycin (VANCOCIN) 2,000 mg in sodium chloride 0.9 % 500 mL IVPB  Status:  Discontinued        2,000 mg 260 mL/hr over 120 Minutes Intravenous  Once 06/18/23 0311 06/18/23 0614      Culture/Microbiology    Component Value Date/Time   SDES BLOOD LEFT HAND 06/18/2023 0345   SPECREQUEST  06/18/2023 0345    BOTTLES DRAWN AEROBIC AND ANAEROBIC Blood Culture adequate volume   CULT  06/18/2023 0345    NO GROWTH 1 DAY Performed at [facility], [address], [place], [place] [address]    REPTSTATUS PENDING 06/18/2023 0345    Other culture-see note  Radiology Studies: CT HEAD WO CONTRAST ( )  Result Date: 06/18/2023 CLINICAL DATA:  32 year old homeless female with altered mental status. Fever, sepsis, thigh cellulitis, positive COVID-19. EXAM: CT HEAD WITHOUT CONTRAST TECHNIQUE: Contiguous axial images were obtained from the base of the skull through the vertex without intravenous contrast. RADIATION DOSE REDUCTION: This exam was performed according to the departmental dose-optimization program which includes automated exposure control, adjustment of the mA and/or kV according to patient size and/or use of iterative reconstruction technique. COMPARISON:  Head CT 06/16/2018. FINDINGS: Brain: Cerebral volume remains normal. No midline shift, ventriculomegaly, mass effect, evidence of mass lesion, intracranial hemorrhage or evidence of cortically based acute  infarction. Gray-white matter differentiation is within normal limits throughout the brain. Vascular: No suspicious intracranial vascular hyperdensity. Skull: Negative. Sinuses/Orbits: Mild to moderate bilateral paranasal sinus mucosal thickening not significantly changed from 2019. Tympanic cavities and mastoids are clear. Other: Visualized orbits and scalp soft tissues are within normal limits. IMPRESSION: 1. Stable, normal noncontrast CT appearance of the brain. 2. Paranasal sinus inflammation, not significantly changed from 2019 and significance doubtful. Electronically Signed   By: Odessa Fleming M.D.   On: 06/18/2023 10:44   CT ABDOMEN PELVIS W CONTRAST  Result Date: 06/18/2023 CLINICAL DATA:  32 year old female presenting with sepsis. Purulent foul-smelling vaginal discharge. EXAM: CT ABDOMEN AND PELVIS WITH CONTRAST TECHNIQUE: Multidetector CT imaging of the abdomen and pelvis was performed using the standard protocol following bolus administration of intravenous contrast. RADIATION DOSE REDUCTION: This exam was performed according to the departmental dose-optimization program which includes automated exposure control, adjustment of the mA and/or kV according to patient  size and/or use of iterative reconstruction technique. CONTRAST:  75mL OMNIPAQUE IOHEXOL 350 MG/ML SOLN COMPARISON:  CT of the abdomen and pelvis 10/03/2018. FINDINGS: Lower chest: Tiny 3-4 mm pulmonary nodules in the right middle lobe (axial image 1 of series 5) and the left lower lobe (axial image 13 of series 5). Hepatobiliary: No suspicious cystic or solid hepatic lesions. No intra or extrahepatic biliary ductal dilatation. Gallbladder is unremarkable in appearance. Pancreas: No pancreatic mass. No pancreatic ductal dilatation. No pancreatic or peripancreatic fluid collections or inflammatory changes. Spleen: Unremarkable. Adrenals/Urinary Tract: Bilateral kidneys and adrenal glands are normal in appearance. No hydroureteronephrosis. Urinary  bladder is moderately distended but otherwise unremarkable in appearance. Stomach/Bowel: The appearance of the stomach is normal. There is no pathologic dilatation of small bowel or colon. Normal appendix. Vascular/Lymphatic: No atherosclerotic calcifications are noted in the abdominal aorta or pelvic vasculature. Numerous prominent borderline enlarged and mildly enlarged inguinal lymph nodes bilaterally, measuring up to 1.3 cm on the left (axial image 80 of series 3). Multiple other prominent borderline enlarged pelvic and retroperitoneal lymph nodes measuring up to 8 mm in short axis in the left para-aortic nodal station (axial image 34 of series 3), nonspecific. Reproductive: Uterus and left ovary are unremarkable in appearance. In the right adnexal region (axial image 70 of series 3 and coronal image 27 of series 6) there is a 4.5 x 2.4 x 3.8 cm low-attenuation lesion, which has an appearance suggestive of a simple ovarian cyst (no imaging follow-up recommended). Asymmetry of soft tissue along the left side of the vaginal introitus (axial image 86 of series 3). Other: No significant volume of ascites.  No pneumoperitoneum. Musculoskeletal: There are no aggressive appearing lytic or blastic lesions noted in the visualized portions of the skeleton. IMPRESSION: 1. No acute findings are noted in the abdomen or pelvis. 2. Asymmetry of soft tissue along the left side of the vaginal introitus with numerous prominent borderline enlarged and mildly enlarged inguinal lymph nodes bilaterally, as well as numerous prominent borderline enlarged pelvic and retroperitoneal lymph nodes. Findings are nonspecific, but could suggest malignancy. Correlation with physical examination is recommended. 3. Tiny 3-4 mm pulmonary nodules in the right middle lobe and left lower lobe, nonspecific, but statistically likely benign. No follow-up needed if patient is low-risk (and has no known or suspected primary neoplasm). Non-contrast chest  CT can be considered in 12 months if patient is high-risk. This recommendation follows the consensus statement: Guidelines for Management of Incidental Pulmonary Nodules Detected on CT Images: From the Fleischner Society 2017; Radiology 2017; 284:228-243. Electronically Signed   By: Trudie Reed M.D.   On: 06/18/2023 05:47   DG Chest Port 1 View  Result Date: 06/18/2023 CLINICAL DATA:  Sepsis EXAM: PORTABLE CHEST 1 VIEW COMPARISON:  10/03/2018 FINDINGS: The heart size and mediastinal contours are within normal limits. No focal airspace consolidation or pulmonary edema. The visualized skeletal structures are unremarkable. IMPRESSION: No active disease. Electronically Signed   By: Deatra Robinson M.D.   On: 06/18/2023 03:23     LOS: 0 days   Lanae Boast, MD [facility]  06/19/2023, 10:13 AM

## 2023-06-19 NOTE — Progress Notes (Signed)
[facility]    Date of Admission:  06/18/2023   Total days of antibiotics 2  ID: Jill Zamora is a 32 y.o. 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 Her partner is sleeping in the room. No other recent sti. Vaginal lesions have only started in the past 2-3 months  Medications:   enoxaparin (LOVENOX) injection  40 mg Subcutaneous Daily   lidocaine  1 Application Topical Once   lidocaine-EPINEPHrine  10 mL Intradermal UD   metroNIDAZOLE  500 mg Oral Q12H    Objective: Vital signs in last 24 hours: Temp:  [98.1 F (36.7 C)-102.1 F (38.9 C)] 99.3 F (37.4 C) (08/08 0803) Pulse Rate:  [73-100] 73 (08/08 0803) Resp:  [18] 18 (08/08 0803) BP: (92-105)/(55-70) 105/69 (08/08 0803) SpO2:  [97 %-99 %] 98 % (08/08 0803) Physical Exam  Constitutional:  oriented to person, place, and time. appears well-developed and well-nourished. No distress.  HENT: [place]/AT, PERRLA, no scleral icterus Mouth/Throat: Oropharynx is clear and moist. No oropharyngeal exudate.  Cardiovascular: Normal rate, regular rhythm and normal heart sounds. Exam reveals no gallop and no friction rub.  No murmur heard.  Pulmonary/Chest: Effort normal and breath sounds normal. No respiratory distress.  has no wheezes.  Neck = supple, no nuchal rigidity Abdominal: Soft. Bowel sounds are normal.  exhibits no distension. There is no tenderness.  Lymphadenopathy: no cervical adenopathy. No axillary adenopathy Neurological: alert and oriented to person, place, and time.  Skin: Skin is warm and dry. No rash noted. No  erythema.  Psychiatric: a normal mood and affect.  behavior is normal.    Lab Results Recent Labs    06/18/23 0318 06/18/23 0835 06/19/23 0325  WBC 6.8 5.5 3.2*  HGB 14.2 13.0 12.1  HCT 43.7 40.0 37.6  NA 133*  --  138  K 3.7  --  3.5  CL 104  --  109  CO2 18*  --  22  BUN 12  --  10  CREATININE 0.95 0.92 1.05*   Liver Panel Recent Labs    06/18/23 0318  PROT 7.4  ALBUMIN 4.0  AST 19  ALT 10  ALKPHOS 57  BILITOT 0.5   Sedimentation Rate No results for input(s): "ESRSEDRATE" in the last 72 hours. C-Reactive Protein No results for input(s): "CRP" in the last 72 hours.  Microbiology: reviewed Studies/Results: CT HEAD WO CONTRAST ( )  Result Date: 06/18/2023 CLINICAL DATA:  32 year old homeless female with altered mental status. Fever, sepsis, thigh cellulitis, positive COVID-19. EXAM: CT HEAD WITHOUT CONTRAST TECHNIQUE: Contiguous axial images were obtained from the base of the skull through the vertex without intravenous contrast. RADIATION DOSE REDUCTION: This exam was performed according to the departmental dose-optimization program which includes automated exposure control, adjustment of the mA and/or kV according to patient size and/or use of iterative reconstruction technique. COMPARISON:  Head CT 06/16/2018. FINDINGS: Brain: Cerebral volume remains normal. No midline shift, ventriculomegaly, mass effect, evidence of mass lesion, intracranial hemorrhage or evidence of cortically based acute infarction. Gray-white matter differentiation is within normal limits throughout the  brain. Vascular: No suspicious intracranial vascular hyperdensity. Skull: Negative. Sinuses/Orbits: Mild to moderate bilateral paranasal sinus mucosal thickening not significantly changed from 2019. Tympanic cavities and mastoids are clear. Other: Visualized orbits and scalp soft tissues are within normal limits. IMPRESSION: 1. Stable, normal noncontrast CT appearance of the brain. 2. Paranasal sinus  inflammation, not significantly changed from 2019 and significance doubtful. Electronically Signed   By: Odessa Fleming M.D.   On: 06/18/2023 10:44   CT ABDOMEN PELVIS W CONTRAST  Result Date: 06/18/2023 CLINICAL DATA:  32 year old female presenting with sepsis. Purulent foul-smelling vaginal discharge. EXAM: CT ABDOMEN AND PELVIS WITH CONTRAST TECHNIQUE: Multidetector CT imaging of the abdomen and pelvis was performed using the standard protocol following bolus administration of intravenous contrast. RADIATION DOSE REDUCTION: This exam was performed according to the departmental dose-optimization program which includes automated exposure control, adjustment of the mA and/or kV according to patient size and/or use of iterative reconstruction technique. CONTRAST:  75mL OMNIPAQUE IOHEXOL 350 MG/ML SOLN COMPARISON:  CT of the abdomen and pelvis 10/03/2018. FINDINGS: Lower chest: Tiny 3-4 mm pulmonary nodules in the right middle lobe (axial image 1 of series 5) and the left lower lobe (axial image 13 of series 5). Hepatobiliary: No suspicious cystic or solid hepatic lesions. No intra or extrahepatic biliary ductal dilatation. Gallbladder is unremarkable in appearance. Pancreas: No pancreatic mass. No pancreatic ductal dilatation. No pancreatic or peripancreatic fluid collections or inflammatory changes. Spleen: Unremarkable. Adrenals/Urinary Tract: Bilateral kidneys and adrenal glands are normal in appearance. No hydroureteronephrosis. Urinary bladder is moderately distended but otherwise unremarkable in appearance. Stomach/Bowel: The appearance of the stomach is normal. There is no pathologic dilatation of small bowel or colon. Normal appendix. Vascular/Lymphatic: No atherosclerotic calcifications are noted in the abdominal aorta or pelvic vasculature. Numerous prominent borderline enlarged and mildly enlarged inguinal lymph nodes bilaterally, measuring up to 1.3 cm on the left (axial image 80 of series 3). Multiple other  prominent borderline enlarged pelvic and retroperitoneal lymph nodes measuring up to 8 mm in short axis in the left para-aortic nodal station (axial image 34 of series 3), nonspecific. Reproductive: Uterus and left ovary are unremarkable in appearance. In the right adnexal region (axial image 70 of series 3 and coronal image 27 of series 6) there is a 4.5 x 2.4 x 3.8 cm low-attenuation lesion, which has an appearance suggestive of a simple ovarian cyst (no imaging follow-up recommended). Asymmetry of soft tissue along the left side of the vaginal introitus (axial image 86 of series 3). Other: No significant volume of ascites.  No pneumoperitoneum. Musculoskeletal: There are no aggressive appearing lytic or blastic lesions noted in the visualized portions of the skeleton. IMPRESSION: 1. No acute findings are noted in the abdomen or pelvis. 2. Asymmetry of soft tissue along the left side of the vaginal introitus with numerous prominent borderline enlarged and mildly enlarged inguinal lymph nodes bilaterally, as well as numerous prominent borderline enlarged pelvic and retroperitoneal lymph nodes. Findings are nonspecific, but could suggest malignancy. Correlation with physical examination is recommended. 3. Tiny 3-4 mm pulmonary nodules in the right middle lobe and left lower lobe, nonspecific, but statistically likely benign. No follow-up needed if patient is low-risk (and has no known or suspected primary neoplasm). Non-contrast chest CT can be considered in 12 months if patient is high-risk. This recommendation follows the consensus statement: Guidelines for Management of Incidental Pulmonary Nodules Detected on CT Images: From the Fleischner Society 2017; Radiology 2017; 284:228-243. Electronically Signed   By: Reuel Boom  Entrikin M.D.   On: 06/18/2023 05:47   DG Chest Port 1 View  Result Date: 06/18/2023 CLINICAL DATA:  Sepsis EXAM: PORTABLE CHEST 1 VIEW COMPARISON:  10/03/2018 FINDINGS: The heart size and  mediastinal contours are within normal limits. No focal airspace consolidation or pulmonary edema. The visualized skeletal structures are unremarkable. IMPRESSION: No active disease. Electronically Signed   By: Deatra Robinson M.D.   On: 06/18/2023 03:23     Assessment/Plan: Secondary syphilis = received IM penicillin.  BV = finish out course of metronidazole 500mg  po bid on full stomach to minimize nausea  Sti partner notification = recommend treatment of partner  Covid-19 = recommend 10 day of isolation. Can discuss with patient if she can be adherent to 5 days of masking if discharged on Sunday.  [facility] for Infect[facility] Diseases Pager: [phone]  06/19/2023, 3:24 PM

## 2023-06-19 NOTE — Progress Notes (Addendum)
TR[facility] night cross cover note:   I was notified by RN that the patient is complaining of 2 to 3 weeks of urinary incontinence.  Specifically, the patient reports that she gets the urge to urinate, and then spontaneously voids her bladder before being able to make it to the bathroom.  Not associate they report dysuria.  Her description of the above appears suggestive of urge urinary incontinence.  I have ordered a urinalysis as well as a bladder scan this time.  Update: after the above episode of urinary incontinence that occurred between 2000 - 2100 on 06/18/23, without any known interval urination, bladder scan around 4 AM was noted to be 402 cc's. Does not appear to be a post-void residual measurement.    Newton Pigg, DO Hospitalist

## 2023-06-20 ENCOUNTER — Other Ambulatory Visit (HOSPITAL_COMMUNITY): Payer: Self-pay | Admitting: Hematology

## 2023-06-20 DIAGNOSIS — Z7251 High risk heterosexual behavior: Secondary | ICD-10-CM

## 2023-06-20 DIAGNOSIS — L03116 Cellulitis of left lower limb: Secondary | ICD-10-CM | POA: Diagnosis not present

## 2023-06-20 DIAGNOSIS — B372 Candidiasis of skin and nail: Secondary | ICD-10-CM | POA: Diagnosis not present

## 2023-06-20 DIAGNOSIS — A630 Anogenital (venereal) warts: Secondary | ICD-10-CM

## 2023-06-20 DIAGNOSIS — A5149 Other secondary syphilitic conditions: Secondary | ICD-10-CM | POA: Diagnosis not present

## 2023-06-20 DIAGNOSIS — N899 Noninflammatory disorder of vagina, unspecified: Secondary | ICD-10-CM

## 2023-06-20 NOTE — Progress Notes (Signed)
PROGRESS NOTE Jill Zamora  VZD:[id number] DOB: Apr 03, 1991 DOA: 06/18/2023 PCP: Patient, No Pcp Per  Brief Narrative/Hospital Course: 32 yo WF medical history significant of depression, anxiety, cocaine use, homelessness was brought to the hospital for evaluation of fever,superficial vaginal infection. In the ED she was drowsy, apparently patient came to the ED about 3 weeks ago with similar complaint in the thigh and vaginal lesion and has been ongoing x 2 to 3 months, has 1 sexually active partner, there was suspicion of warts HIV was negative was discharged home with OB/GYN follow-up. In ED Patient denying any complaints but clearly upon admission she was noted to be septic secondary to likely bilateral inner thigh cellulitis and she was found to be COVID-positive. She was started on broad-spectrum antibiotic, GYN, ID was consulted and admitted  CT of the abdomen pelvis showed concerns of lymphadenopathy in the pelvic area and pulmonary nodule     Subjective: Seen and examined. Afebrile overnight On RA No new complaints complains of pain in vaginal area and using lidocaine gel   Assessment and Plan: Principal Problem:   Cellulitis of left leg Active Problems:   Marijuana abuse   Sepsis due to cellulitis (HCC)   Anxiety   Pulmonary nodule   Syphilis   Vulvar cellulitis   Cellulitis of leg   Secondary syphilis with syphilic condyloma lata  Vulvar lesions worsening BV: Seen by ID, GYN  S/P biopsy and PAP 8/7 and gyn advised Flagyl 500 mg twice daily for BV.  STI partner treatment recommended by ID.Given elevated syphillis titer likely reinfection.Pap smear negative for malignancy, negative for intraepithelial lesion normal reference range HPV negative.  Surgical pathology in process ID has discontinued vancomycin cefepime, and s/p 1 dose of IM Bicillin for treatment,  GC and chlamydia negative.Blood culture no growth to date. Cont symptomatic pain management  Initially suspected sepsis  due to# 1: Sepsis rule out antibiotics discontinued  COVID-19 infection, has had fever: Needs 10 days of as needed/contact isolation no need for antiviral since minimal symptoms and not high risk for severe disease.  Patient willing to continue with mask upon discharge over the weekend  Altered mental status with lethargy/drowsiness: Resolved alert awake oriented x 3.  CT head stable unchanged findings.  UDS + for cocaine THC. TSH ammonia normal  3-4 mm pulmonary nodule: need outpatient follow-up w/ pcp  History of anxiety/depression/polysubstance abuse/former/: Not on home meds, ToC consult  DVT prophylaxis: enoxaparin (LOVENOX) injection 40 mg Start: 06/18/23 1000 Code Status:   Code Status: Full Code Family Communication: plan of care discussed with patient at bedside. Patient status is:  admitted as observation but remains hospitalized for ongoing  because of ongoing vaginal pain Level of care: Med-Surg   Dispo: The patient is from: homeless            Anticipated disposition: previous place.  Possible discharge over the weekend. Toc consult for resources Objective: Vitals last 24 hrs: Vitals:   06/19/23 2014 06/19/23 2015 06/20/23 0413 06/20/23 0723  BP: 106/84 107/81 (!) 88/54 98/65  Pulse:   74 65  Resp:   18 16  Temp:   98.5 F (36.9 C) 98.1 F (36.7 C)  TempSrc:   Oral Oral  SpO2:   100% 99%  Weight:      Height:       Weight change:   Physical Examination: General exam: alert awake, oriented at baseline, older than stated age HEENT:Oral mucosa moist, Ear/Nose WNL grossly Respiratory system: Bilaterally clear BS,no  use of accessory muscle Cardiovascular system: S1 & S2 +, No JVD. Gastrointestinal system: Abdomen soft,NT,ND, BS+ Nervous System: Alert, awake, moving all extremities,and following commands. Extremities: LE edema neg,distal peripheral pulses palpable and warm.  Skin: No rashes,no icterus. MSK: Normal muscle bulk,tone, power   Medications  reviewed:  Scheduled Meds:  enoxaparin (LOVENOX) injection  40 mg Subcutaneous Daily   lidocaine-EPINEPHrine  10 mL Intradermal UD   metroNIDAZOLE  500 mg Oral Q12H   pneumococcal 20-valent conjugate vaccine  0.5 mL Intramuscular Tomorrow-1000   Continuous Infusions:    Diet Order             Diet regular Room service appropriate? Yes; Fluid consistency: Thin  Diet effective now                 No intake or output data in the 24 hours ending 06/20/23 0927  Net IO Since Admission: 4,388.69 mL [06/20/23 0927]  Wt Readings from Last 3 Encounters:  06/18/23 81.6 kg  07/06/22 135 kg  02/07/20 135 kg     Unresulted Labs (From admission, onward)     Start     Ordered   06/25/23 0500  Creatinine, serum  (enoxaparin (LOVENOX)    CrCl >/= 30 ml/min)  Weekly,   R     Comments: while on enoxaparin therapy    06/18/23 0737          Data Reviewed: I have personally reviewed following labs and imaging studies CBC: Recent Labs  Lab 06/18/23 0318 06/18/23 0835 06/19/23 0325  WBC 6.8 5.5 3.2*  NEUTROABS 5.8  --   --   HGB 14.2 13.0 12.1  HCT 43.7 40.0 37.6  MCV 92.6 93.5 96.2  PLT 264 191 165   Basic Metabolic Panel: Recent Labs  Lab 06/18/23 0318 06/18/23 0835 06/19/23 0325  NA 133*  --  138  K 3.7  --  3.5  CL 104  --  109  CO2 18*  --  22  GLUCOSE 86  --  88  BUN 12  --  10  CREATININE 0.95 0.92 1.05*  CALCIUM 8.4*  --  7.8*  MG  --   --  2.0   GFR: Estimated Creatinine Clearance: 82.8 mL/min (A) (by C-G formula based on SCr of 1.05 mg/dL (H)). Liver Function Tests: Recent Labs  Lab 06/18/23 0318  AST 19  ALT 10  ALKPHOS 57  BILITOT 0.5  PROT 7.4  ALBUMIN 4.0   No results for input(s): "LIPASE", "AMYLASE" in the last 168 hours. Recent Labs  Lab 06/18/23 0835  AMMONIA 31   Coagulation Profile: Recent Labs  Lab 06/18/23 0318  INR 1.1   Recent Labs    06/18/23 0318  TSH 0.568   Sepsis Labs: Recent Labs  Lab 06/18/23 0342  06/18/23 0526  LATICACIDVEN 2.1* 0.7   Recent Results (from the past 240 hour(s))  Blood Culture (routine x 2)     Status: None (Preliminary result)   Collection Time: 06/18/23  3:08 AM   Specimen: BLOOD RIGHT HAND  Result Value Ref Range Status   Specimen Description BLOOD RIGHT HAND  Final   Special Requests   Final    AEROBIC BOTTLE ONLY Blood Culture results may not be optimal due to an inadequate volume of blood received in culture bottles   Culture   Final    NO GROWTH 2 DAYS Performed at [facility], [address] [place], [place] [address]    Report  Status PENDING  Incomplete  Culture, blood (single)     Status: None (Preliminary result)   Collection Time: 06/18/23  3:41 AM   Specimen: BLOOD RIGHT HAND  Result Value Ref Range Status   Specimen Description BLOOD RIGHT HAND  Final   Special Requests   Final    BOTTLES DRAWN AEROBIC AND ANAEROBIC Blood Culture adequate volume   Culture   Final    NO GROWTH 2 DAYS Performed at [facility], [address], [place], [place] [address]    Report Status PENDING  Incomplete  Blood Culture (routine x 2)     Status: None (Preliminary result)   Collection Time: 06/18/23  3:45 AM   Specimen: BLOOD LEFT HAND  Result Value Ref Range Status   Specimen Description BLOOD LEFT HAND  Final   Special Requests   Final    BOTTLES DRAWN AEROBIC AND ANAEROBIC Blood Culture adequate volume   Culture   Final    NO GROWTH 2 DAYS Performed at [facility], [address] [place], [place] [address]    Report Status PENDING  Incomplete  SARS Coronavirus 2 by RT PCR (hospital order, performed in [facility] hospital lab) *cepheid single result test* Anterior Nasal Swab     Status: Abnormal   Collection Time: 06/18/23  3:56 AM   Specimen: Anterior Nasal Swab  Result Value Ref Range Status   SARS Coronavirus 2 by RT PCR POSITIVE (A) NEGATIVE Final    Comment: Performed at [facility], [address], [place],  [place] [address]  Wet prep, genital     Status: Abnormal   Collection Time: 06/18/23  1:30 PM   Specimen: PATH Cytology Cervicovaginal Ancillary Only  Result Value Ref Range Status   Yeast Wet Prep HPF POC NONE SEEN NONE SEEN Final   Trich, Wet Prep NONE SEEN NONE SEEN Final   Clue Cells Wet Prep HPF POC NONE SEEN NONE SEEN Final   WBC, Wet Prep HPF POC >=10 (A) <10 Final   Sperm NONE SEEN  Final    Comment: Performed at [facility], [address] [place], [place] [address]    Antimicrobials: Anti-infectives (From admission, onward)    Start     Dose/Rate Route Frequency Ordered Stop   06/19/23 1015  metroNIDAZOLE (FLAGYL) tablet 500 mg        500 mg Oral Every 12 hours 06/19/23 0917 06/26/23 0959   06/18/23 2000  vancomycin (VANCOCIN) IVPB 1000 mg/200 mL premix  Status:  Discontinued        1,000 mg 200 mL/hr over 60 Minutes Intravenous Every 12 hours 06/18/23 0759 06/18/23 1456   06/18/23 1600  penicillin g benzathine (BICILLIN LA) 1200000 UNIT/2ML injection 2.4 Million Units        2.4 Million Units Intramuscular  Once 06/18/23 1520 06/18/23 1554   06/18/23 1200  metroNIDAZOLE (FLAGYL) IVPB 500 mg  Status:  Discontinued        500 mg 100 mL/hr over 60 Minutes Intravenous 2 times daily 06/18/23 0733 06/18/23 1531   06/18/23 1200  ceFEPIme (MAXIPIME) 2 g in sodium chloride 0.9 % 100 mL IVPB  Status:  Discontinued        2 g 200 mL/hr over 30 Minutes Intravenous Every 8 hours 06/18/23 0754 06/18/23 1456   06/18/23 0445  vancomycin (VANCOCIN) IVPB 1000 mg/200 mL premix       Placed in "Followed by" Linked Group   1,000 mg  200 mL/hr over 60 Minutes Intravenous  Once 06/18/23 0342 06/18/23 0734   06/18/23 0345  vancomycin (VANCOCIN) IVPB 1000 mg/200 mL premix       Placed in "Followed by" Linked Group   1,000 mg 200 mL/hr over 60 Minutes Intravenous  Once 06/18/23 0342 06/18/23 0620   06/18/23 0315  ceFEPIme (MAXIPIME) 2 g in sodium chloride 0.9 % 100 mL IVPB        2 g 200 mL/hr  over 30 Minutes Intravenous  Once 06/18/23 0307 06/18/23 0409   06/18/23 0315  metroNIDAZOLE (FLAGYL) IVPB 500 mg        500 mg 100 mL/hr over 60 Minutes Intravenous  Once 06/18/23 0307 06/18/23 0354   06/18/23 0315  vancomycin (VANCOCIN) IVPB 1000 mg/200 mL premix  Status:  Discontinued        1,000 mg 200 mL/hr over 60 Minutes Intravenous  Once 06/18/23 0307 06/18/23 0308   06/18/23 0315  vancomycin (VANCOCIN) 2,000 mg in sodium chloride 0.9 % 500 mL IVPB  Status:  Discontinued        2,000 mg 260 mL/hr over 120 Minutes Intravenous  Once 06/18/23 0310 06/18/23 0311   06/18/23 0315  vancomycin (VANCOCIN) 2,000 mg in sodium chloride 0.9 % 500 mL IVPB  Status:  Discontinued        2,000 mg 260 mL/hr over 120 Minutes Intravenous  Once 06/18/23 0311 06/18/23 0614      Culture/Microbiology    Component Value Date/Time   SDES BLOOD LEFT HAND 06/18/2023 0345   SPECREQUEST  06/18/2023 0345    BOTTLES DRAWN AEROBIC AND ANAEROBIC Blood Culture adequate volume   CULT  06/18/2023 0345    NO GROWTH 2 DAYS Performed at [facility], [address] [place], [place] [address]    REPTSTATUS PENDING 06/18/2023 0345    Other culture-see note  Radiology Studies: CT HEAD WO CONTRAST ( )  Result Date: 06/18/2023 CLINICAL DATA:  33 year old homeless female with altered mental status. Fever, sepsis, thigh cellulitis, positive COVID-19. EXAM: CT HEAD WITHOUT CONTRAST TECHNIQUE: Contiguous axial images were obtained from the base of the skull through the vertex without intravenous contrast. RADIATION DOSE REDUCTION: This exam was performed according to the departmental dose-optimization program which includes automated exposure control, adjustment of the mA and/or kV according to patient size and/or use of iterative reconstruction technique. COMPARISON:  Head CT 06/16/2018. FINDINGS: Brain: Cerebral volume remains normal. No midline shift, ventriculomegaly, mass effect, evidence of mass lesion,  intracranial hemorrhage or evidence of cortically based acute infarction. Gray-white matter differentiation is within normal limits throughout the brain. Vascular: No suspicious intracranial vascular hyperdensity. Skull: Negative. Sinuses/Orbits: Mild to moderate bilateral paranasal sinus mucosal thickening not significantly changed from 2019. Tympanic cavities and mastoids are clear. Other: Visualized orbits and scalp soft tissues are within normal limits. IMPRESSION: 1. Stable, normal noncontrast CT appearance of the brain. 2. Paranasal sinus inflammation, not significantly changed from 2019 and significance doubtful. Electronically Signed   By: Odessa Fleming M.D.   On: 06/18/2023 10:44     LOS: 1 day   Lanae Boast, MD [facility]  06/20/2023, 9:27 AM

## 2023-06-20 NOTE — Progress Notes (Addendum)
    [facility]    Date of Admission:  06/18/2023   Total days of antibiotics 4          ID: Jill Zamora is a 32 y.o. female with   Principal Problem:   Cellulitis of left leg Active Problems:   Marijuana abuse   Sepsis due to cellulitis (HCC)   Anxiety   Pulmonary nodule   Syphilis   Vulvar cellulitis   Cellulitis of leg    Subjective: Less vaginal discomfort when using lidocaine gel. She feels rash is improving  Medications:   enoxaparin (LOVENOX) injection  40 mg Subcutaneous Daily   lidocaine-EPINEPHrine  10 mL Intradermal UD   metroNIDAZOLE  500 mg Oral Q12H    Objective: Vital signs in last 24 hours: Temp:  [98.1 F (36.7 C)-98.5 F (36.9 C)] 98.1 F (36.7 C) (08/09 0723) Pulse Rate:  [65-79] 65 (08/09 0723) Resp:  [16-18] 16 (08/09 0723) BP: (88-113)/(54-84) 98/65 (08/09 0723) SpO2:  [99 %-100 %] 99 % (08/09 0723)  Physical Exam  Constitutional:  oriented to person, place, and time. appears well-developed and well-nourished. No distress.  HENT: [place]/AT, PERRLA, no scleral icterus Mouth/Throat: Oropharynx is clear and moist. No oropharyngeal exudate.  Cardiovascular: Normal rate, regular rhythm and normal heart sounds. Exam reveals no gallop and no friction rub.  No murmur heard.  Pulmonary/Chest: Effort normal and breath sounds normal. No respiratory distress.  has no wheezes.  Neck = supple, no nuchal rigidity Abdominal: Soft. Bowel sounds are normal.  exhibits no distension. There is no tenderness.  Gu= lesions less inflammed; less erythema Lymphadenopathy: no cervical adenopathy. No axillary adenopathy Neurological: alert and oriented to person, place, and time.  Skin: Skin is warm and dry. No rash noted. No erythema.  Psychiatric: a normal mood and affect.  behavior is normal.    Lab Results Recent Labs    06/18/23 0318 06/18/23 0835 06/19/23 0325  WBC 6.8 5.5 3.2*  HGB 14.2 13.0 12.1  HCT 43.7 40.0 37.6  NA 133*  --   138  K 3.7  --  3.5  CL 104  --  109  CO2 18*  --  22  BUN 12  --  10  CREATININE 0.95 0.92 1.05*   Liver Panel Recent Labs    06/18/23 0318  PROT 7.4  ALBUMIN 4.0  AST 19  ALT 10  ALKPHOS 57  BILITOT 0.5    Microbiology: Hpv negative Gc negative  Chlamydia negative Studies/Results: No results found.   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 Recommend follow up at health department for her and her partner. Her partner will need IM penicillin for exposure. Recommend no sexual intercourse until her partner is treated, otherwise she will have reinfection  Vaginal lesion = continue with as needed lidocaine cream  Candidal skin infection = continue with antifungal cream until improved  Condyloma = suspect it is syphilic. Path is pending. Recommend to follow up with gynecology.  [facility] [phone]  High risk sex = will off PrEP through clinic  Asymptomatic covid = 5th day of isolation is tomorrow. Can discharge with plans to mask in public for 5 additional days   Will have outpatient follow up  [facility] Pager: [phone]  06/20/2023, 1:25 PM

## 2023-06-20 NOTE — Plan of Care (Signed)
Problem: Education: Goal: Knowledge of General Education information will improve Description: Including pain rating scale, medication(s)/side effects and non-pharmacologic comfort measures Outcome: Progressing Pt understands she was admitted into the hospital for fever, drowsiness, and inner thigh/ vaginal lesion which was diagnosis as cellulitis.  Pt was also found to be Covid positive.    Problem: Clinical Measurements: Goal: Ability to maintain clinical measurements within normal limits will improve Outcome: Progressing Pt's VS WNL except for her BP where she was noted to be hypotensive.  MD is aware.   Problem: Clinical Measurements: Goal: Will remain free from infection Outcome: Progressing S/Sx of infection monitored and assessed q8 hours.  Pt has remained afebrile thus far.  She is positive for Covid this admission.  Pt is on Airborne/ Contact precaution d/t her diagnosis of Covid.      Problem: Clinical Measurements: Goal: Respiratory complications will improve Outcome: Progressing Respiratory status monitored and assessed q8 hours.  Pt is on room air with PO2 saturations at 99-100% and respirations of 16 breaths per minute.  She has not endorse c/o SOB and DOE.    Problem: Activity: Goal: Risk for activity intolerance will decrease Outcome: Progressing Pt is independent of all her ADLs.  She was observed OOB ambulating in her room with a steady gait.   Problem: Nutrition: Goal: Adequate nutrition will be maintained Outcome: Progressing Pt is on a regular diet per MD's orders.  She has been able to tolerate her diet without s/sx of n/v or abdominal pain/ distention.   Problem: Elimination: Goal: Will not experience complications related to bowel motility Outcome: Progressing Pt LBM was in 06/20/2023.  She denies c/o constipation.    Problem: Elimination: Goal: Will not experience complications related to urinary retention Outcome: Progressing Pt has denied c/o dysuria  or abdominal distention/ pain.   Problem: Pain Management: Goal: General experience of comfort will improve Outcome: Progressing Pt has denied pain thus far.  Problem: Safety: Goal: Ability to remain free from injury will improve Outcome: Progressing Pt has remained free from falls thus far.  Instructed pt to utilize RN call light for assistance.  Hourly rounds performed.  Bed in lowest position, locked with two upper side rails engaged.  Belongings and call light within reach.    Problem: Skin Integrity: Goal: Risk for impaired skin integrity will decrease Outcome: Progressing Skin integrity monitored and assessed q-shift.  Instructed pt to turn/ reposition herself q2 hours to prevent further skin impairment.  Tubes and drains assessed for device related pressure sores.  Pt is continent of both bowel and bladder.  Drssing changes performed per MD's orders.

## 2023-06-21 ENCOUNTER — Other Ambulatory Visit (HOSPITAL_COMMUNITY): Payer: Self-pay

## 2023-06-21 DIAGNOSIS — L03116 Cellulitis of left lower limb: Secondary | ICD-10-CM | POA: Diagnosis not present

## 2023-06-21 MED ORDER — LIDOCAINE 5 % EX OINT
TOPICAL_OINTMENT | CUTANEOUS | 0 refills | Status: AC | PRN
  Filled 2023-06-21: qty 35.44, 5d supply, fill #0

## 2023-06-21 MED ORDER — METRONIDAZOLE 500 MG PO TABS
500.0000 mg | ORAL_TABLET | Freq: Two times a day (BID) | ORAL | 0 refills | Status: AC
  Filled 2023-06-21: qty 10, 5d supply, fill #0

## 2023-06-21 NOTE — Progress Notes (Signed)
Bus pass provided to pt and her request. Pt's SO at bedside. No other SW needs identified.   Dellie Burns, MSW, LCSW [phone] (coverage)

## 2023-06-21 NOTE — TOC Transition Note (Signed)
 Discharge medications are filled and stored in the [facility] awaiting patient discharge.

## 2023-06-21 NOTE — Discharge Summary (Signed)
Physician Discharge Summary  Jill Zamora MPN:[id number] DOB: 09-28-1991 DOA: 06/18/2023  PCP: Patient, No Pcp Per  Admit date: 06/18/2023 Discharge date: 06/21/2023 Recommendations for Outpatient Follow-up:  Follow up with PCP in 1 weeks-call for appointment Please obtain BMP/CBC in one week  Discharge Dispo: home Discharge Condition: Stable Code Status:   Code Status: Full Code Diet recommendation:  Diet Order             Diet regular Room service appropriate? Yes; Fluid consistency: Thin  Diet effective now                    Brief/Interim Summary: 32 yo WF medical history significant of depression, anxiety, cocaine use, homelessness was brought to the hospital for evaluation of fever,superficial vaginal infection. In the ED she was drowsy, apparently patient came to the ED about 3 weeks ago with similar complaint in the thigh and vaginal lesion and has been ongoing x 2 to 3 months, has 1 sexually active partner, there was suspicion of warts HIV was negative was discharged home with OB/GYN follow-up. In ED Patient denying any complaints but clearly upon admission she was noted to be septic secondary to likely bilateral inner thigh cellulitis and she was found to be COVID-positive. She was started on broad-spectrum antibiotic, GYN, ID was consulted and admitted  CT of the abdomen pelvis showed concerns of lymphadenopathy in the pelvic area and pulmonary nodule Patient diagnosed with secondary syphilis given IM penicillin x 1 and improved> advised follow-up with health department for her and her partner and her partner will need IM penicillin for exposure ID recommended no sexual intercourse until her partner is treated otherwise she will have infection Candida skin lesion continue with antifungal cream vaginal lesion continue lidocaine cream Condyloma likely syphilitic pathology is pending > need fu at [facility] through [facility]. On Tuesday and Thursday at [facility] center [facility]  care. [phone]:DTOIZ number provided  Patient has asymptomatic COVID fifth day of isolation on 8/and will discharge with plan for mask for additional 5 days. Overall patient has improved and she is requesting for discharge 06/21/23.   Discharge Diagnoses:  Principal Problem:   Cellulitis of left leg Active Problems:   Marijuana abuse   Sepsis due to cellulitis [facility])   Anxiety   Pulmonary nodule   Syphilis   Vulvar cellulitis   Cellulitis of leg    Secondary syphilis with syphilic condyloma lata  Vulvar lesions worsening BV: Seen by ID, GYN  S/P biopsy and PAP 8/7 and gyn advised Flagyl 500 mg twice daily for BV.  STI partner treatment recommended by ID.Given elevated syphillis titer likely reinfection.Pap smear negative for malignancy, negative for intraepithelial lesion normal reference range HPV negative.  Surgical pathology in process ID has discontinued vancomycin cefepime, and s/p 1 dose of IM Bicillin for treatment,  GC and chlamydia negative.Blood culture no growth to date. Cont symptomatic pain management Personally counseled her for health dept follow up and to f/u OBGYN for biopsy report, phone no provided.  Initially suspected sepsis due to# 1: Sepsis rule out antibiotics discontinued  COVID-19 infection, has had fever: Needs 10 days of as needed/contact isolation no need for antiviral since minimal symptoms and not high risk for severe disease.  Patient willing to continue with mask upon discharge over the weekend  Altered mental status with lethargy/drowsiness: Resolved alert awake oriented x 3.  CT head stable unchanged findings.  UDS + for cocaine THC. TSH ammonia normal  3-4 mm  pulmonary nodule: need outpatient follow-up w/ pcp  History of anxiety/depression/polysubstance abuse/former/: Not on home meds, ToC consult     Consults: ID and OB/GYN Subjective: Aaox3, requesting for discharge  Discharge Exam: Vitals:   06/21/23 0507 06/21/23 0732  BP: 91/61  97/60  Pulse: 70 (!) 59  Resp: 18 16  Temp: 98.5 F (36.9 C) 98.4 F (36.9 C)  SpO2: 100% 97%   General: Pt is alert, awake, not in acute distress Cardiovascular: RRR, S1/S2 +, no rubs, no gallops Respiratory: CTA bilaterally, no wheezing, no rhonchi Abdominal: Soft, NT, ND, bowel sounds + Extremities: no edema, no cyanosis  Discharge Instructions  Discharge Instructions     Discharge instructions   Complete by: As directed    You have a pulmonary nodule that needs follow-up to make sure he does not become cancerous  Please follow-up with OB/GYN physician in the number provided or woman health clinic-for biopsy result  Please follow-up with the health department, your sexual partner will need treatment-you are advised to avoid sexual encounter with your partner until your partner is treated to prevent reinfection.  Due to COVID 19 positive result please use mask for additional 5 more days whenever you are in public spaces are in close proximity with other individuals    Please call call MD or return to ER for similar or worsening recurring problem that brought you to hospital or if any fever,nausea/vomiting,abdominal pain, uncontrolled pain, chest pain,  shortness of breath or any other alarming symptoms.  Please follow-up your doctor as instructed in a week time and call the office for appointment.  Please avoid alcohol, smoking, or any other illicit substance and maintain healthy habits including taking your regular medications as prescribed.  You were cared for by a hospitalist during your hospital stay. If you have any questions about your discharge medications or the care you received while you were in the hospital after you are discharged, you can call the unit and ask to speak with the hospitalist on call if the hospitalist that took care of you is not available.  Once you are discharged, your primary care physician will handle any further medical issues. Please note that  NO REFILLS for any discharge medications will be authorized once you are discharged, as it is imperative that you return to your primary care physician (or establish a relationship with a primary care physician if you do not have one) for your aftercare needs so that they can reassess your need for medications and monitor your lab values   Increase activity slowly   Complete by: As directed    No wound care   Complete by: As directed       Allergies as of 06/21/2023   No Known Allergies      Medication List     STOP taking these medications    cephALEXin 500 MG capsule Commonly known as: KEFLEX       TAKE these medications    ibuprofen 600 MG tablet Commonly known as: ADVIL Take 1 tablet (600 mg total) by mouth every 6 (six) hours as needed.   lidocaine 5 % ointment Commonly known as: XYLOCAINE Apply topically as needed for up to 5 days.   metroNIDAZOLE 500 MG tablet Commonly known as: FLAGYL Take 1 tablet (500 mg total) by mouth every 12 (twelve) hours for 5 days.        Follow-up Information     [facility] Follow up on 07/10/2023.  Specialty: Infectious Diseases Why: 2:00 pm appointment with Pharmacy Team. Please arrive 15 minutes early to accomodate for parking and to register. Contact information: [address] [place], [address] [place] [address] [phone]        womens's health Follow up.   Why: need fu at [facility] clinic through [facility]. On Tuesday and Thursday at [facility] center [facility] care. [phone]:IRJJO number provided        [place] Bing, MD Follow up in 1 week(s).   Specialty: Obstetrics and Gynecology Why: Please call next week for biopsy report and for further follow-up Contact information: [address] [place] [address] [phone]                No Known Allergies  The results of significant diagnostics from this hospitalization (including  imaging, microbiology, ancillary and laboratory) are listed below for reference.    Microbiology: Recent Results (from the past 240 hour(s))  Blood Culture (routine x 2)     Status: None (Preliminary result)   Collection Time: 06/18/23  3:08 AM   Specimen: BLOOD RIGHT HAND  Result Value Ref Range Status   Specimen Description BLOOD RIGHT HAND  Final   Special Requests   Final    AEROBIC BOTTLE ONLY Blood Culture results may not be optimal due to an inadequate volume of blood received in culture bottles   Culture   Final    NO GROWTH 3 DAYS Performed at [facility], [address] [place], [place] [address]    Report Status PENDING  Incomplete  Culture, blood (single)     Status: None (Preliminary result)   Collection Time: 06/18/23  3:41 AM   Specimen: BLOOD RIGHT HAND  Result Value Ref Range Status   Specimen Description BLOOD RIGHT HAND  Final   Special Requests   Final    BOTTLES DRAWN AEROBIC AND ANAEROBIC Blood Culture adequate volume   Culture   Final    NO GROWTH 3 DAYS Performed at [facility], [address] [place], [place] [address]    Report Status PENDING  Incomplete  Blood Culture (routine x 2)     Status: None (Preliminary result)   Collection Time: 06/18/23  3:45 AM   Specimen: BLOOD LEFT HAND  Result Value Ref Range Status   Specimen Description BLOOD LEFT HAND  Final   Special Requests   Final    BOTTLES DRAWN AEROBIC AND ANAEROBIC Blood Culture adequate volume   Culture   Final    NO GROWTH 3 DAYS Performed at [facility], [address] [place], [place] [address]    Report Status PENDING  Incomplete  SARS Coronavirus 2 by RT PCR (hospital order, performed in [facility] hospital lab) *cepheid single result test* Anterior Nasal Swab     Status: Abnormal   Collection Time: 06/18/23  3:56 AM   Specimen: Anterior Nasal Swab  Result Value Ref Range Status   SARS Coronavirus 2 by RT PCR POSITIVE (A) NEGATIVE Final    Comment:  Performed at [facility], [address] [place], [place] [address]  Wet prep, genital     Status: Abnormal   Collection Time: 06/18/23  1:30 PM   Specimen: PATH Cytology Cervicovaginal Ancillary Only  Result Value Ref Range Status   Yeast Wet Prep HPF POC NONE SEEN NONE SEEN Final   Trich, Wet Prep NONE SEEN NONE SEEN Final   Clue Cells Wet Prep  HPF POC NONE SEEN NONE SEEN Final   WBC, Wet Prep HPF POC >=10 (A) <10 Final   Sperm NONE SEEN  Final    Comment: Performed at [facility], [address] [place], [place] [address]    Procedures/Studies: CT HEAD WO CONTRAST ( )  Result Date: 06/18/2023 CLINICAL DATA:  32 year old homeless female with altered mental status. Fever, sepsis, thigh cellulitis, positive COVID-19. EXAM: CT HEAD WITHOUT CONTRAST TECHNIQUE: Contiguous axial images were obtained from the base of the skull through the vertex without intravenous contrast. RADIATION DOSE REDUCTION: This exam was performed according to the departmental dose-optimization program which includes automated exposure control, adjustment of the mA and/or kV according to patient size and/or use of iterative reconstruction technique. COMPARISON:  Head CT 06/16/2018. FINDINGS: Brain: Cerebral volume remains normal. No midline shift, ventriculomegaly, mass effect, evidence of mass lesion, intracranial hemorrhage or evidence of cortically based acute infarction. Gray-white matter differentiation is within normal limits throughout the brain. Vascular: No suspicious intracranial vascular hyperdensity. Skull: Negative. Sinuses/Orbits: Mild to moderate bilateral paranasal sinus mucosal thickening not significantly changed from 2019. Tympanic cavities and mastoids are clear. Other: Visualized orbits and scalp soft tissues are within normal limits. IMPRESSION: 1. Stable, normal noncontrast CT appearance of the brain. 2. Paranasal sinus inflammation, not significantly changed from 2019 and significance  doubtful. Electronically Signed   By: Odessa Fleming M.D.   On: 06/18/2023 10:44   CT ABDOMEN PELVIS W CONTRAST  Result Date: 06/18/2023 CLINICAL DATA:  32 year old female presenting with sepsis. Purulent foul-smelling vaginal discharge. EXAM: CT ABDOMEN AND PELVIS WITH CONTRAST TECHNIQUE: Multidetector CT imaging of the abdomen and pelvis was performed using the standard protocol following bolus administration of intravenous contrast. RADIATION DOSE REDUCTION: This exam was performed according to the departmental dose-optimization program which includes automated exposure control, adjustment of the mA and/or kV according to patient size and/or use of iterative reconstruction technique. CONTRAST:  75mL OMNIPAQUE IOHEXOL 350 MG/ML SOLN COMPARISON:  CT of the abdomen and pelvis 10/03/2018. FINDINGS: Lower chest: Tiny 3-4 mm pulmonary nodules in the right middle lobe (axial image 1 of series 5) and the left lower lobe (axial image 13 of series 5). Hepatobiliary: No suspicious cystic or solid hepatic lesions. No intra or extrahepatic biliary ductal dilatation. Gallbladder is unremarkable in appearance. Pancreas: No pancreatic mass. No pancreatic ductal dilatation. No pancreatic or peripancreatic fluid collections or inflammatory changes. Spleen: Unremarkable. Adrenals/Urinary Tract: Bilateral kidneys and adrenal glands are normal in appearance. No hydroureteronephrosis. Urinary bladder is moderately distended but otherwise unremarkable in appearance. Stomach/Bowel: The appearance of the stomach is normal. There is no pathologic dilatation of small bowel or colon. Normal appendix. Vascular/Lymphatic: No atherosclerotic calcifications are noted in the abdominal aorta or pelvic vasculature. Numerous prominent borderline enlarged and mildly enlarged inguinal lymph nodes bilaterally, measuring up to 1.3 cm on the left (axial image 80 of series 3). Multiple other prominent borderline enlarged pelvic and retroperitoneal lymph  nodes measuring up to 8 mm in short axis in the left para-aortic nodal station (axial image 34 of series 3), nonspecific. Reproductive: Uterus and left ovary are unremarkable in appearance. In the right adnexal region (axial image 70 of series 3 and coronal image 27 of series 6) there is a 4.5 x 2.4 x 3.8 cm low-attenuation lesion, which has an appearance suggestive of a simple ovarian cyst (no imaging follow-up recommended). Asymmetry of soft tissue along the left side of the vaginal introitus (axial image 86 of series 3). Other: No  significant volume of ascites.  No pneumoperitoneum. Musculoskeletal: There are no aggressive appearing lytic or blastic lesions noted in the visualized portions of the skeleton. IMPRESSION: 1. No acute findings are noted in the abdomen or pelvis. 2. Asymmetry of soft tissue along the left side of the vaginal introitus with numerous prominent borderline enlarged and mildly enlarged inguinal lymph nodes bilaterally, as well as numerous prominent borderline enlarged pelvic and retroperitoneal lymph nodes. Findings are nonspecific, but could suggest malignancy. Correlation with physical examination is recommended. 3. Tiny 3-4 mm pulmonary nodules in the right middle lobe and left lower lobe, nonspecific, but statistically likely benign. No follow-up needed if patient is low-risk (and has no known or suspected primary neoplasm). Non-contrast chest CT can be considered in 12 months if patient is high-risk. This recommendation follows the consensus statement: Guidelines for Management of Incidental Pulmonary Nodules Detected on CT Images: From the Fleischner Society 2017; Radiology 2017; 284:228-243. Electronically Signed   By: Trudie Reed M.D.   On: 06/18/2023 05:47   DG Chest Port 1 View  Result Date: 06/18/2023 CLINICAL DATA:  Sepsis EXAM: PORTABLE CHEST 1 VIEW COMPARISON:  10/03/2018 FINDINGS: The heart size and mediastinal contours are within normal limits. No focal airspace  consolidation or pulmonary edema. The visualized skeletal structures are unremarkable. IMPRESSION: No active disease. Electronically Signed   By: Deatra Robinson M.D.   On: 06/18/2023 03:23    Labs: BNP (last 3 results) No results for input(s): "BNP" in the last 8760 hours. Basic Metabolic Panel: Recent Labs  Lab 06/18/23 0318 06/18/23 0835 06/19/23 0325  NA 133*  --  138  K 3.7  --  3.5  CL 104  --  109  CO2 18*  --  22  GLUCOSE 86  --  88  BUN 12  --  10  CREATININE 0.95 0.92 1.05*  CALCIUM 8.4*  --  7.8*  MG  --   --  2.0   Liver Function Tests: Recent Labs  Lab 06/18/23 0318  AST 19  ALT 10  ALKPHOS 57  BILITOT 0.5  PROT 7.4  ALBUMIN 4.0   No results for input(s): "LIPASE", "AMYLASE" in the last 168 hours. Recent Labs  Lab 06/18/23 0835  AMMONIA 31   CBC: Recent Labs  Lab 06/18/23 0318 06/18/23 0835 06/19/23 0325  WBC 6.8 5.5 3.2*  NEUTROABS 5.8  --   --   HGB 14.2 13.0 12.1  HCT 43.7 40.0 37.6  MCV 92.6 93.5 96.2  PLT 264 191 165   Cardiac Enzymes: No results for input(s): "CKTOTAL", "CKMB", "CKMBINDEX", "TROPONINI" in the last 168 hours. BNP: Invalid input(s): "POCBNP" CBG: No results for input(s): "GLUCAP" in the last 168 hours. D-Dimer No results for input(s): "DDIMER" in the last 72 hours. Hgb A1c No results for input(s): "HGBA1C" in the last 72 hours. Lipid Profile No results for input(s): "CHOL", "HDL", "LDLCALC", "TRIG", "CHOLHDL", "LDLDIRECT" in the last 72 hours. Thyroid function studies No results for input(s): "TSH", "T4TOTAL", "T3FREE", "THYROIDAB" in the last 72 hours.  Invalid input(s): "FREET3" Anemia work up No results for input(s): "VITAMINB12", "FOLATE", "FERRITIN", "TIBC", "IRON", "RETICCTPCT" in the last 72 hours. Urinalysis    Component Value Date/Time   COLORURINE YELLOW 06/19/2023 0235   APPEARANCEUR CLEAR 06/19/2023 0235   LABSPEC 1.017 06/19/2023 0235   PHURINE 5.0 06/19/2023 0235   GLUCOSEU NEGATIVE 06/19/2023  0235   HGBUR MODERATE (A) 06/19/2023 0235   BILIRUBINUR NEGATIVE 06/19/2023 0235   KETONESUR NEGATIVE 06/19/2023 0235  PROTEINUR NEGATIVE 06/19/2023 0235   UROBILINOGEN 1.0 01/10/2013 1954   NITRITE NEGATIVE 06/19/2023 0235   LEUKOCYTESUR MODERATE (A) 06/19/2023 0235   Sepsis Labs Recent Labs  Lab 06/18/23 0318 06/18/23 0835 06/19/23 0325  WBC 6.8 5.5 3.2*   Microbiology Recent Results (from the past 240 hour(s))  Blood Culture (routine x 2)     Status: None (Preliminary result)   Collection Time: 06/18/23  3:08 AM   Specimen: BLOOD RIGHT HAND  Result Value Ref Range Status   Specimen Description BLOOD RIGHT HAND  Final   Special Requests   Final    AEROBIC BOTTLE ONLY Blood Culture results may not be optimal due to an inadequate volume of blood received in culture bottles   Culture   Final    NO GROWTH 3 DAYS Performed at [facility], [address] [place], [place] [address]    Report Status PENDING  Incomplete  Culture, blood (single)     Status: None (Preliminary result)   Collection Time: 06/18/23  3:41 AM   Specimen: BLOOD RIGHT HAND  Result Value Ref Range Status   Specimen Description BLOOD RIGHT HAND  Final   Special Requests   Final    BOTTLES DRAWN AEROBIC AND ANAEROBIC Blood Culture adequate volume   Culture   Final    NO GROWTH 3 DAYS Performed at [facility], [address] [place], [place] [address]    Report Status PENDING  Incomplete  Blood Culture (routine x 2)     Status: None (Preliminary result)   Collection Time: 06/18/23  3:45 AM   Specimen: BLOOD LEFT HAND  Result Value Ref Range Status   Specimen Description BLOOD LEFT HAND  Final   Special Requests   Final    BOTTLES DRAWN AEROBIC AND ANAEROBIC Blood Culture adequate volume   Culture   Final    NO GROWTH 3 DAYS Performed at [facility], [address], [place], [place] [address]    Report Status PENDING  Incomplete  SARS Coronavirus 2 by RT PCR (hospital order,  performed in [facility] hospital lab) *cepheid single result test* Anterior Nasal Swab     Status: Abnormal   Collection Time: 06/18/23  3:56 AM   Specimen: Anterior Nasal Swab  Result Value Ref Range Status   SARS Coronavirus 2 by RT PCR POSITIVE (A) NEGATIVE Final    Comment: Performed at [facility], [address] [place], [place] [address]  Wet prep, genital     Status: Abnormal   Collection Time: 06/18/23  1:30 PM   Specimen: PATH Cytology Cervicovaginal Ancillary Only  Result Value Ref Range Status   Yeast Wet Prep HPF POC NONE SEEN NONE SEEN Final   Trich, Wet Prep NONE SEEN NONE SEEN Final   Clue Cells Wet Prep HPF POC NONE SEEN NONE SEEN Final   WBC, Wet Prep HPF POC >=10 (A) <10 Final   Sperm NONE SEEN  Final    Comment: Performed at [facility], [address], [place], [place] [address]     Time coordinating discharge: 25 minutes  SIGNED: Lanae Boast, MD  [facility] 06/21/2023, 3:11 PM  If 7PM-7AM, please contact night-coverage [email or web address]

## 2023-07-03 ENCOUNTER — Telehealth: Payer: Self-pay | Admitting: Obstetrics and Gynecology

## 2023-07-03 NOTE — Telephone Encounter (Signed)
Attempted to call patient to review benign biopsy results. No answer. Left voicemail for pt to return our call.   Harvie Bridge, MD Obstetrician & Gynecologist, [facility] for [employer], [facility]

## 2023-07-08 NOTE — Progress Notes (Incomplete)
   Date:  07/08/2023   HPI: Jill Zamora is a 32 y.o. female who presents to the [facility] pharmacy clinic for HIV PrEP follow-up.  Insured   [x]    Uninsured  []    Patient Active Problem List   Diagnosis Date Noted   Cellulitis of leg 06/19/2023   Sepsis due to cellulitis (HCC) 06/18/2023   Anxiety 06/18/2023   Pulmonary nodule 06/18/2023   Cellulitis of left leg 06/18/2023   Syphilis 06/18/2023   Vulvar cellulitis 06/18/2023   Hypokalemia 10/05/2018   TOA (tubo-ovarian abscess) 10/03/2018   Marijuana abuse 10/03/2018    Patient's Medications  New Prescriptions   No medications on file  Previous Medications   IBUPROFEN (ADVIL) 600 MG TABLET    Take 1 tablet (600 mg total) by mouth every 6 (six) hours as needed.  Modified Medications   No medications on file  Discontinued Medications   No medications on file        No data to display          Labs:  SCr: Lab Results  Component Value Date   CREATININE 1.05 (H) 06/19/2023   CREATININE 0.92 06/18/2023   CREATININE 0.95 06/18/2023   CREATININE 0.82 05/27/2023   CREATININE 0.84 02/07/2020   HIV Lab Results  Component Value Date   HIV Non Reactive 06/18/2023   HIV Non Reactive 05/27/2023   HIV Non Reactive 10/03/2018   HIV Non Reactive 06/16/2018   Hepatitis B No results found for: "HEPBSAB", "HEPBSAG", "HEPBCAB" Hepatitis C No results found for: "HEPCAB", "HCVRNAPCRQN" Hepatitis A No results found for: "HAV" RPR and STI Lab Results  Component Value Date   LABRPR Reactive (A) 05/27/2023   LABRPR Non Reactive 10/03/2018    STI Results GC GC CT CT  Latest Ref Rng & Units  NEGATIVE  NEGATIVE  06/18/2023 12:47 PM Negative   Negative    10/03/2018 12:00 AM Negative   Negative    01/10/2013  8:40 PM  NEGATIVE   POSITIVE   02/21/2011  9:40 PM   NEGATIVE (NOTE)              ***Normal Reference Range: Negative***  Testing performed using the BD ProbeTec Qx Chlamydia trachomatis and Neisseria gonorrhea  amplified DNA assay.  Performed at:  [facility]                [address]               [place], [place] [address]               [id number]      Assessment: ***  Plan: ***  Cassie L. Kuppelweiser, PharmD, BCIDP, AAHIVP, CPP Clinical Pharmacist Practitioner Infectious Diseases Clinical Pharmacist [facility] 07/08/2023, 8:58 PM

## 2023-07-10 ENCOUNTER — Ambulatory Visit: Payer: 59 | Admitting: Pharmacist
